# Patient Record
Sex: Male | Born: 1940 | Race: White | Hispanic: No | Marital: Married | Smoking: Never smoker
Health system: Southern US, Community
[De-identification: ages and names within clinical notes are randomized; demographics above are authoritative.]

## PROBLEM LIST (undated history)

## (undated) DIAGNOSIS — I495 Sick sinus syndrome: Secondary | ICD-10-CM

## (undated) DIAGNOSIS — I4891 Unspecified atrial fibrillation: Secondary | ICD-10-CM

## (undated) DIAGNOSIS — E785 Hyperlipidemia, unspecified: Secondary | ICD-10-CM

## (undated) DIAGNOSIS — N200 Calculus of kidney: Secondary | ICD-10-CM

## (undated) DIAGNOSIS — I10 Essential (primary) hypertension: Secondary | ICD-10-CM

## (undated) HISTORY — DX: Sick sinus syndrome: I49.5

## (undated) HISTORY — DX: Essential (primary) hypertension: I10

## (undated) HISTORY — DX: Unspecified atrial fibrillation: I48.91

## (undated) HISTORY — PX: INSERT / REPLACE / REMOVE PACEMAKER: SUR710

## (undated) HISTORY — DX: Hyperlipidemia, unspecified: E78.5

## (undated) HISTORY — DX: Calculus of kidney: N20.0

---

## 2008-10-19 ENCOUNTER — Telehealth (INDEPENDENT_AMBULATORY_CARE_PROVIDER_SITE_OTHER): Payer: Self-pay | Admitting: *Deleted

## 2008-10-19 ENCOUNTER — Ambulatory Visit: Payer: Self-pay | Admitting: Internal Medicine

## 2008-10-26 ENCOUNTER — Ambulatory Visit: Payer: Self-pay | Admitting: Cardiovascular Disease

## 2008-10-28 DIAGNOSIS — N200 Calculus of kidney: Secondary | ICD-10-CM | POA: Insufficient documentation

## 2008-10-28 DIAGNOSIS — I4891 Unspecified atrial fibrillation: Secondary | ICD-10-CM

## 2008-10-28 DIAGNOSIS — E785 Hyperlipidemia, unspecified: Secondary | ICD-10-CM | POA: Insufficient documentation

## 2008-10-28 DIAGNOSIS — I10 Essential (primary) hypertension: Secondary | ICD-10-CM | POA: Insufficient documentation

## 2008-10-28 DIAGNOSIS — Z950 Presence of cardiac pacemaker: Secondary | ICD-10-CM | POA: Insufficient documentation

## 2008-10-28 DIAGNOSIS — I480 Paroxysmal atrial fibrillation: Secondary | ICD-10-CM | POA: Insufficient documentation

## 2008-10-28 DIAGNOSIS — I495 Sick sinus syndrome: Secondary | ICD-10-CM | POA: Insufficient documentation

## 2008-11-02 ENCOUNTER — Ambulatory Visit: Payer: Self-pay | Admitting: Cardiology

## 2008-11-09 ENCOUNTER — Ambulatory Visit: Payer: Self-pay | Admitting: Internal Medicine

## 2008-11-30 ENCOUNTER — Ambulatory Visit: Payer: Self-pay | Admitting: Cardiovascular Disease

## 2008-12-08 ENCOUNTER — Encounter: Payer: Self-pay | Admitting: *Deleted

## 2009-01-13 ENCOUNTER — Encounter: Payer: Self-pay | Admitting: *Deleted

## 2009-02-15 ENCOUNTER — Encounter: Payer: Self-pay | Admitting: Cardiology

## 2009-02-15 ENCOUNTER — Ambulatory Visit: Payer: Self-pay | Admitting: Cardiology

## 2009-02-15 LAB — CONVERTED CEMR LAB: POC INR: 1.6

## 2009-03-01 ENCOUNTER — Ambulatory Visit: Payer: Self-pay | Admitting: Cardiovascular Disease

## 2009-03-01 ENCOUNTER — Encounter (INDEPENDENT_AMBULATORY_CARE_PROVIDER_SITE_OTHER): Payer: Self-pay | Admitting: Cardiology

## 2009-03-29 ENCOUNTER — Ambulatory Visit: Payer: Self-pay | Admitting: Cardiology

## 2009-03-29 LAB — CONVERTED CEMR LAB: POC INR: 2.5

## 2009-04-26 ENCOUNTER — Ambulatory Visit: Payer: Self-pay | Admitting: Cardiovascular Disease

## 2009-04-26 LAB — CONVERTED CEMR LAB: POC INR: 1.6

## 2009-05-20 ENCOUNTER — Ambulatory Visit: Payer: Self-pay | Admitting: Internal Medicine

## 2009-05-20 LAB — CONVERTED CEMR LAB: POC INR: 2.5

## 2009-06-10 ENCOUNTER — Ambulatory Visit: Payer: Self-pay | Admitting: Cardiovascular Disease

## 2009-06-10 LAB — CONVERTED CEMR LAB
INR: 3.4
POC INR: 3.4

## 2009-07-01 ENCOUNTER — Ambulatory Visit: Payer: Self-pay | Admitting: Cardiology

## 2009-07-29 ENCOUNTER — Ambulatory Visit: Payer: Self-pay | Admitting: Cardiology

## 2009-08-31 ENCOUNTER — Ambulatory Visit: Payer: Self-pay | Admitting: Cardiology

## 2009-09-29 ENCOUNTER — Ambulatory Visit: Payer: Self-pay | Admitting: Internal Medicine

## 2009-09-29 LAB — CONVERTED CEMR LAB: POC INR: 2.6

## 2009-10-27 ENCOUNTER — Ambulatory Visit: Payer: Self-pay | Admitting: Cardiology

## 2009-10-27 LAB — CONVERTED CEMR LAB: POC INR: 3.3

## 2009-11-16 ENCOUNTER — Ambulatory Visit: Payer: Self-pay | Admitting: Cardiovascular Disease

## 2009-11-16 ENCOUNTER — Ambulatory Visit: Payer: Self-pay | Admitting: Internal Medicine

## 2009-11-16 ENCOUNTER — Encounter: Payer: Self-pay | Admitting: Internal Medicine

## 2009-11-16 LAB — CONVERTED CEMR LAB: POC INR: 2.8

## 2010-01-21 ENCOUNTER — Telehealth (INDEPENDENT_AMBULATORY_CARE_PROVIDER_SITE_OTHER): Payer: Self-pay

## 2010-01-21 ENCOUNTER — Encounter (INDEPENDENT_AMBULATORY_CARE_PROVIDER_SITE_OTHER): Payer: Self-pay | Admitting: Pharmacist

## 2010-01-27 ENCOUNTER — Ambulatory Visit: Payer: Self-pay | Admitting: Cardiology

## 2010-01-27 LAB — CONVERTED CEMR LAB: POC INR: 2.6

## 2010-03-29 ENCOUNTER — Ambulatory Visit: Payer: Self-pay | Admitting: Cardiology

## 2010-03-29 LAB — CONVERTED CEMR LAB: POC INR: 2

## 2010-06-08 ENCOUNTER — Telehealth: Payer: Self-pay | Admitting: Internal Medicine

## 2010-06-09 ENCOUNTER — Ambulatory Visit: Payer: Self-pay | Admitting: Cardiovascular Disease

## 2010-06-09 LAB — CONVERTED CEMR LAB: POC INR: 2.6

## 2010-06-17 ENCOUNTER — Encounter: Payer: Self-pay | Admitting: Internal Medicine

## 2010-06-17 ENCOUNTER — Ambulatory Visit: Payer: Self-pay | Admitting: Internal Medicine

## 2010-06-17 ENCOUNTER — Encounter: Payer: Self-pay | Admitting: Physician Assistant

## 2010-06-24 ENCOUNTER — Telehealth (INDEPENDENT_AMBULATORY_CARE_PROVIDER_SITE_OTHER): Payer: Self-pay | Admitting: *Deleted

## 2010-06-27 ENCOUNTER — Ambulatory Visit: Payer: Self-pay

## 2010-06-27 ENCOUNTER — Ambulatory Visit: Payer: Self-pay | Admitting: Internal Medicine

## 2010-07-13 ENCOUNTER — Encounter: Payer: Self-pay | Admitting: Internal Medicine

## 2010-07-25 ENCOUNTER — Ambulatory Visit: Admission: RE | Admit: 2010-07-25 | Discharge: 2010-07-25 | Payer: Self-pay | Source: Home / Self Care

## 2010-07-25 LAB — CONVERTED CEMR LAB: POC INR: 1.6

## 2010-08-06 IMAGING — CR DG CHEST 2V
2 series · 2 of 2 positions shown · non-contrast
Comparison: None.

CLINICAL DATA: Paroxysmal atrial fibrillation

CHEST - 2 VIEW

[view not recorded (1 of 2)]
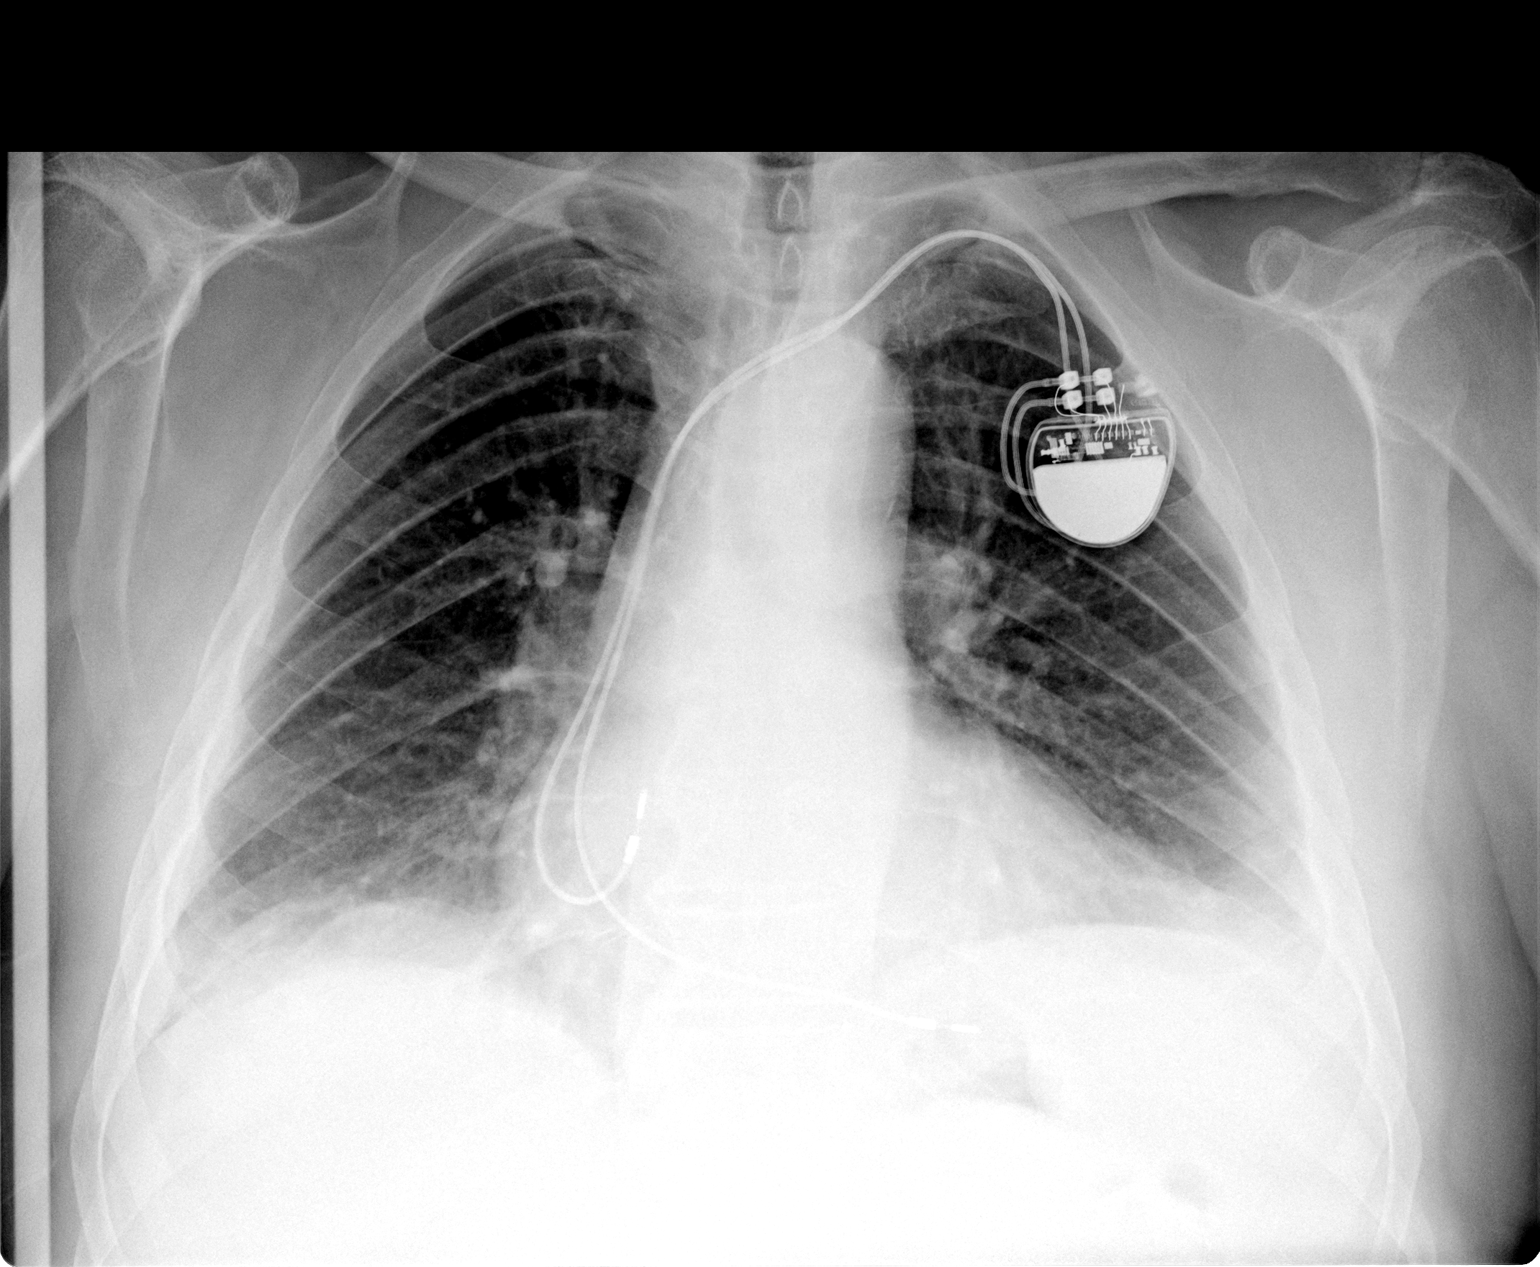

[view not recorded (2 of 2)]
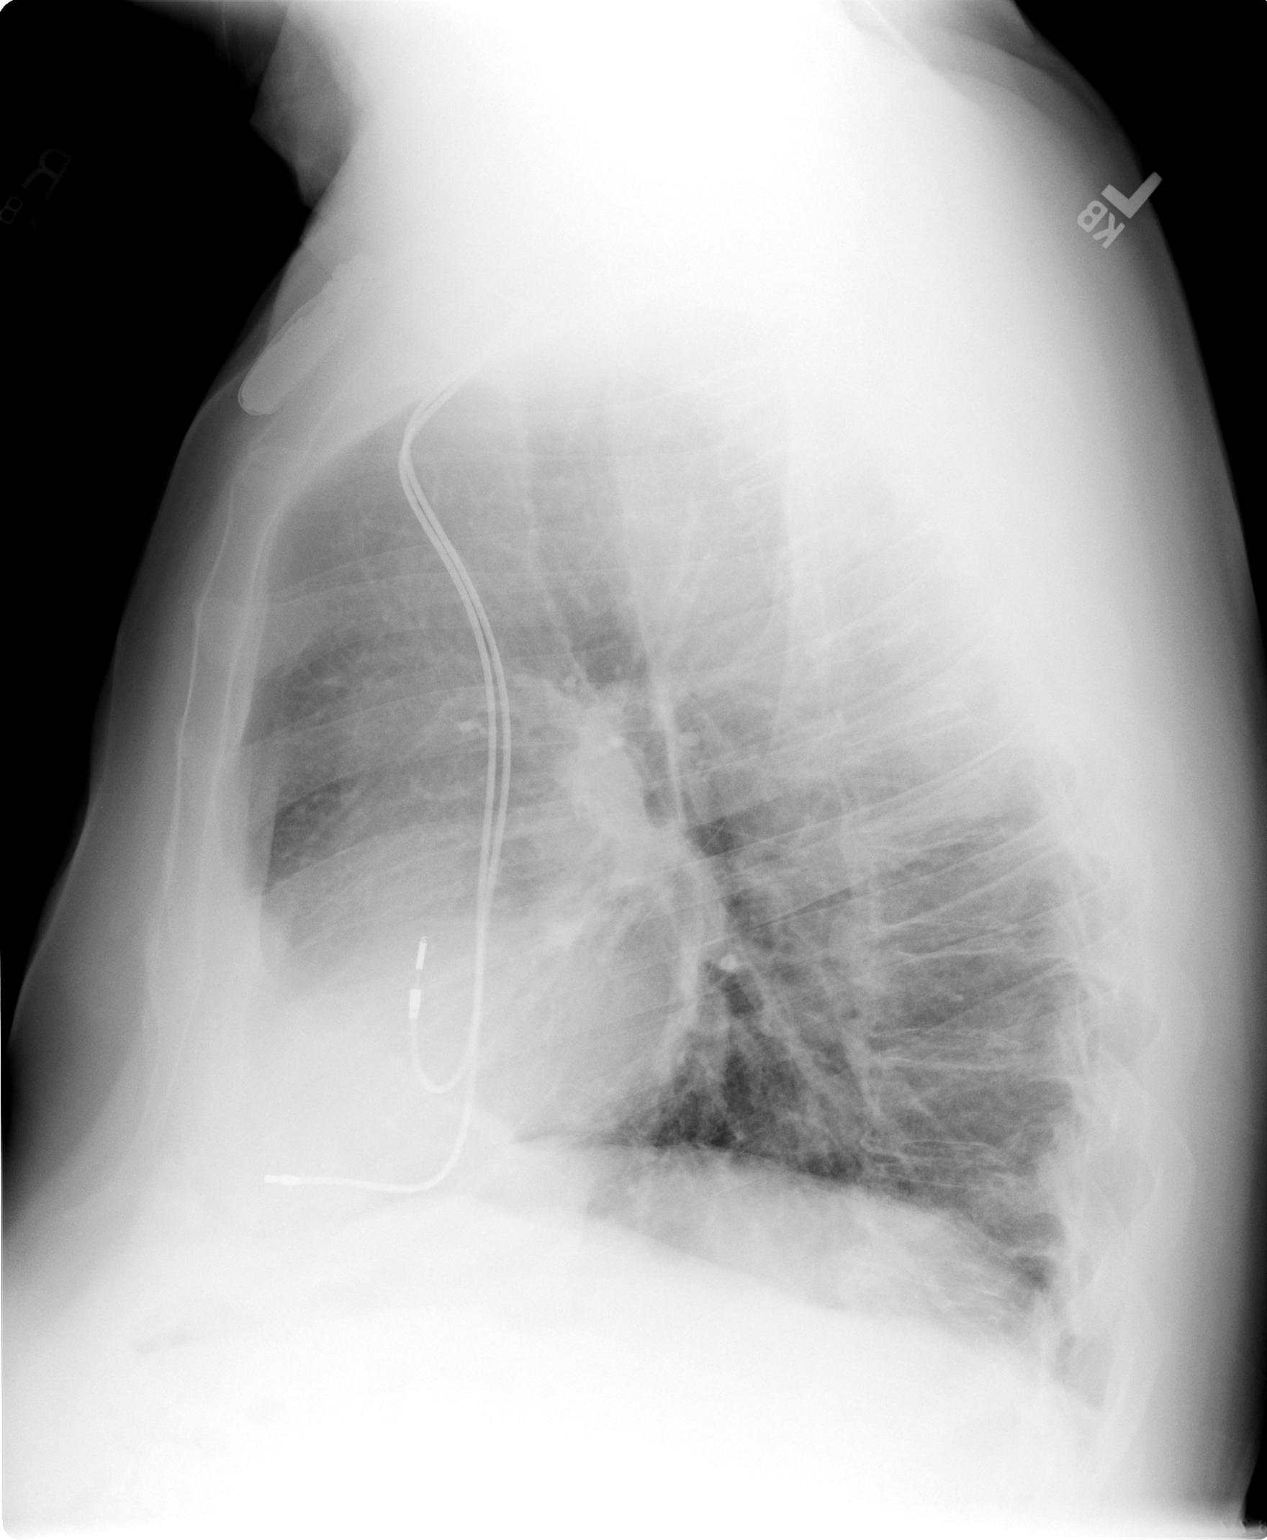

[2 of 2 positions shown; findings below may reference images not displayed]

FINDINGS: Left subclavian dual lead pacer is present, with leads
projecting in the right atrium and right ventricle.  There is
borderline cardiomegaly.  Pulmonary vascularity is mildly
prominent.  Mild bibasilar atelectasis is present.  No definite
pulmonary edema or pleural effusion.  Negative for pneumothorax.
Degenerative changes of the thoracic.  No acute osseous
abnormality.
IMPRESSION: 1.  Borderline cardiomegaly and mild pulmonary vascular congestion.
2.  Mild bibasilar atelectasis.

## 2010-08-08 ENCOUNTER — Ambulatory Visit: Admission: RE | Admit: 2010-08-08 | Discharge: 2010-08-08 | Payer: Self-pay | Source: Home / Self Care

## 2010-08-09 NOTE — Letter (Signed)
Summary: [facility] - Delinquent Coumadin 1  Coumadin  [address]   [place], [place] [address]   Phone: [phone]  Fax: [phone]     January 21, 2010 MRN: [id number]   Jacob Skinner [address] [place], [place]  [address]   Dear [name],  This letter is being sent to you as a reminder that it is necessary for you to get your INR/PT checked regularly so that we can optimize your care.  Our records indicate that you were scheduled to have a test done recently.  As of today, we have not received the results of this test.  It is very important that you have your INR checked.  Please call our office at the number listed above to schedule an appointment at your earliest convenience.    If you have recently had your protime checked or have discontinued this medication, please contact our office at the above phone number to clarify this issue.  Thank you for this prompt attention to this important health care matter.  Sincerely,   [facility] Team

## 2010-08-09 NOTE — Medication Information (Signed)
Summary: rov/ewj  Anticoagulant Therapy  Managed by: Weston Brass, PharmD Supervising MD: Shirlee Latch MD, Merlean Pizzini Indication 1: Atrial Fibrillation Lab Used: [facility] Site: [facility] INR POC 3.3 INR RANGE 2 - 3  Dietary changes: no    Health status changes: no    Bleeding/hemorrhagic complications: no    Recent/future hospitalizations: no    Any changes in medication regimen? no    Recent/future dental: no  Any missed doses?: no       Is patient compliant with meds? yes       Allergies: 1)  ! * Theraflu  Anticoagulation Management History:      The patient is taking warfarin and comes in today for a routine follow up visit.  Positive risk factors for bleeding include an age of 70 years or older.  The bleeding index is 'intermediate risk'.  Positive CHADS2 values include History of HTN.  Negative CHADS2 values include Age > 59 years old.  The start date was 10/19/2008.  His last INR was 3.4.  Anticoagulation responsible provider: Shirlee Latch MD, Caterine Mcmeans.  INR POC: 3.3.  Cuvette Lot#: [id number].  Exp: 11/2010.    Anticoagulation Management Assessment/Plan:      The patient's current anticoagulation dose is Warfarin sodium 5 mg tabs: take as directed.  The target INR is 2 - 3.  The next INR is due 11/17/2009.  Anticoagulation instructions were given to patient/spouse.  Results were reviewed/authorized by Weston Brass, PharmD.  He was notified by Weston Brass PharmD.         Prior Anticoagulation Instructions: INR 2.6  Continue on same dosage 1/2 tablet daily except 1 tablet on Mondays, Wednesdays, and Fridays.  Recheck in 4 weeks.    Current Anticoagulation Instructions: INR 3.3  Take 1/2 tablet today then resume same dose of 1/2 tablet every day except 1 tablet on Monday, Wednesday, and Friday

## 2010-08-09 NOTE — Medication Information (Signed)
Summary: rov/tm  Anticoagulant Therapy  Managed by: Cloyde Reams, RN, BSN Supervising MD: Johney Frame MD, Fayrene Fearing Indication 1: Atrial Fibrillation Lab Used: [facility] Site: [facility] INR POC 2.6 INR RANGE 2 - 3  Dietary changes: no    Health status changes: no    Bleeding/hemorrhagic complications: no    Recent/future hospitalizations: no    Any changes in medication regimen? no    Recent/future dental: no  Any missed doses?: no       Is patient compliant with meds? yes       Allergies (verified): 1)  ! * Theraflu  Anticoagulation Management History:      The patient is taking warfarin and comes in today for a routine follow up visit.  Positive risk factors for bleeding include an age of 70 years or older.  The bleeding index is 'intermediate risk'.  Positive CHADS2 values include History of HTN.  Negative CHADS2 values include Age > 43 years old.  The start date was 10/19/2008.  His last INR was 3.4.  Anticoagulation responsible provider: Mahalie Kanner MD, Fayrene Fearing.  INR POC: 2.6.  Cuvette Lot#: [id number].  Exp: 11/2010.    Anticoagulation Management Assessment/Plan:      The patient's current anticoagulation dose is Warfarin sodium 5 mg tabs: take as directed.  The target INR is 2 - 3.  The next INR is due 10/27/2009.  Anticoagulation instructions were given to patient/spouse.  Results were reviewed/authorized by Cloyde Reams, RN, BSN.  He was notified by Cloyde Reams RN.         Prior Anticoagulation Instructions: INR 3.4 Skip today then resume 1/2 pill everyday except 1 pill on Mondays, Wednesdays and Fridays. Recheck in 3 weeks.   Current Anticoagulation Instructions: INR 2.6  Continue on same dosage 1/2 tablet daily except 1 tablet on Mondays, Wednesdays, and Fridays.  Recheck in 4 weeks.

## 2010-08-09 NOTE — Medication Information (Signed)
Summary: rov/ewj  Anticoagulant Therapy  Managed by: Cloyde Reams, RN, BSN Supervising MD: Riley Kill MD, Maisie Fus Indication 1: Atrial Fibrillation Lab Used: [facility] Site: [facility] INR POC 2.0 INR RANGE 2 - 3  Dietary changes: no    Health status changes: no    Bleeding/hemorrhagic complications: no    Recent/future hospitalizations: no    Any changes in medication regimen? yes       Details: Started on Gabapentin 07/10/09 for neuropathy.  Recent/future dental: no  Any missed doses?: no       Is patient compliant with meds? yes       Current Medications (verified): 1)  Amlodipine Besy-Benazepril Hcl 10-20 Mg Caps (Amlodipine Besy-Benazepril Hcl) .... Take One Tablet Once Daily 2)  Amiodarone Hcl 200 Mg Tabs (Amiodarone Hcl) .... Take 1 Tablet Every Day 3)  Metoprolol Succinate 200 Mg Xr24h-Tab (Metoprolol Succinate) .... Take 1 Tablet Once Daily 4)  Hydrochlorothiazide 25 Mg Tabs (Hydrochlorothiazide) .... Take 1 Tablet Once Daily 5)  Crestor 20 Mg Tabs (Rosuvastatin Calcium) .... Take 1 Tablet Once Daily 6)  Warfarin Sodium 5 Mg Tabs (Warfarin Sodium) .... Take As Directed 7)  Nexium 40 Mg Cpdr (Esomeprazole Magnesium) .... Take 1 Tablet Once Daily 8)  Gabapentin 300 Mg Caps (Gabapentin) .... Take 1 Tablet By Mouth Three Times A Day  Allergies (verified): 1)  ! * Theraflu  Anticoagulation Management History:      The patient is taking warfarin and comes in today for a routine follow up visit.  Positive risk factors for bleeding include an age of 70 years or older.  The bleeding index is 'intermediate risk'.  Positive CHADS2 values include History of HTN.  Negative CHADS2 values include Age > 11 years old.  The start date was 10/19/2008.  His last INR was 3.4.  Anticoagulation responsible provider: Riley Kill MD, Maisie Fus.  INR POC: 2.0.  Cuvette Lot#: [id number].  Exp: 10/2010.    Anticoagulation Management Assessment/Plan:      The patient's current anticoagulation dose is  Warfarin sodium 5 mg tabs: take as directed.  The target INR is 2 - 3.  The next INR is due 08/26/2009.  Anticoagulation instructions were given to patient/spouse.  Results were reviewed/authorized by Cloyde Reams, RN, BSN.  He was notified by Cloyde Reams RN.         Prior Anticoagulation Instructions: INR 2.4  Continue on same dosage 1/2 tablet daily except 1 tablet on Mondays, Wednesdays, and Fridays.   Recheck in 4 weeks.    Current Anticoagulation Instructions: INR 2.0  Continue on same dosage 1/2 tablet daily except 1 tablet on Mondays, Wednesdays, and Fridays.  Recheck in 4 weeks.

## 2010-08-09 NOTE — Cardiovascular Report (Signed)
Summary: Office Visit   Office Visit   Imported By: Roderic Ovens 11/26/2009 16:29:46  _____________________________________________________________________  External Attachment:    Type:   Image     Comment:   External Document

## 2010-08-09 NOTE — Assessment & Plan Note (Signed)
Summary: per check /need chest xray also   Visit Type:  Follow-up   History of Present Illness: Jacob Skinner returns today for followup.  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  He has a h/o symptomatic tachy-brady syndrome, HTN and dyslipidemia and PAF.  He has very mild palpitations. Since I saw him last, his only complaint is musculoskeletal pain in the back.  Current Medications (verified): 1)  Amlodipine Besy-Benazepril Hcl 10-20 Mg Caps (Amlodipine Besy-Benazepril Hcl) .... Take One Tablet Once Daily 2)  Amiodarone Hcl 200 Mg Tabs (Amiodarone Hcl) .... Take 1 Tablet Every Day 3)  Metoprolol Succinate 200 Mg Xr24h-Tab (Metoprolol Succinate) .... Take 1 Tablet Once Daily 4)  Hydrochlorothiazide 25 Mg Tabs (Hydrochlorothiazide) .... Take 1 Tablet Once Daily 5)  Crestor 20 Mg Tabs (Rosuvastatin Calcium) .... Take 1 Tablet Once Daily 6)  Warfarin Sodium 5 Mg Tabs (Warfarin Sodium) .... Take As Directed 7)  Nexium 40 Mg Cpdr (Esomeprazole Magnesium) .... Take 1 Tablet Once Daily  Allergies (verified): 1)  ! 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    Vital Signs:  Patient profile:   70 year old male Height:      70 inches Weight:      231 pounds BMI:     33.26 Pulse rate:   65 / minute BP sitting:   160 / 90  (left arm)  Vitals Entered By: Laurance Flatten CMA (Nov 16, 2009 2:36 PM)  Physical Exam  General:  Well developed, well nourished, in no acute distress. Head:  normocephalic and atraumatic Eyes:  PERRLA/EOM intact;  conjunctiva and lids normal. Mouth:  Teeth, gums and palate normal. Oral mucosa normal. Neck:  Neck supple, no JVD. No masses, thyromegaly or abnormal cervical nodes. Chest Wall:  Well healed PPM function. Lungs:  Clear bilaterally to auscultation.  No wheezes, rales, or rhonchi Heart:  RRR with normal S1 and S2.  PMI is not enlarged or laterally displaced. No murmurs. Abdomen:  Bowel sounds positive; abdomen soft and non-tender without masses, organomegaly, or hernias noted. No hepatosplenomegaly. Msk:  Back normal, normal gait. Muscle strength and tone normal. Pulses:  pulses normal in all 4 extremities Extremities:  No clubbing or cyanosis. No edema. Neurologic:  Alert and oriented x 3.   PPM Specifications Following MD:  Jacob Bunting, MD     PPM Vendor:  [facility] [employer]     PPM Model Number:  [id number]     PPM Serial Number:  [id number] PPM DOI:  05/04/2005     PPM Implanting MD:  Colonel Bald, M.D.  Lead 1    Location: RA     DOI: 05/04/2005     Model #: [id number]     Serial #: [id number]     Status: active Lead 2    Location: RV     DOI: 05/04/2009     Model #: [id number]     Serial #: [id number]     Status: active  PPM Follow Up Remote Check?  No Battery Voltage:  good V     Battery Est. Longevity:  > 5 years     Pacer Dependent:  No  PPM Device Measurements Atrium  Amplitude: 3.0 mV, Impedance: 820 ohms, Threshold: 1.0 V at 0.5 msec Right Ventricle  Amplitude: 10.3 mV, Impedance: 820 ohms, Threshold: 1.3 V at 0.5 msec  Episodes MS Episodes:  18.4K     Percent Mode Switch:  21%     Coumadin:  Yes Atrial Pacing:  48%     Ventricular Pacing:  30%  Parameters Mode:  DDDR     Lower Rate Limit:  60     Upper Rate Limit:  120 Paced AV Delay:  300     Sensed AV Delay:  140 Next Cardiology Appt Due:  05/10/2010 Tech Comments:  No parameter changes.  Device function normal.  ROV 5months clinic. Altha Harm, LPN  Nov 16, 2009 2:44 PM  MD Comments:  Agree with above.   Impression &  Recommendations:  Problem # 1:  PAROXYSMAL ATRIAL FIBRILLATION (ICD-427.31) He is out of rhythm about 20% of the time but is asymptomatic.  I would recommend continued medical therapy as he has done and I will see him back in several months. His updated medication list for this problem includes:    Amiodarone Hcl 200 Mg Tabs (Amiodarone hcl) .Marland Kitchen... Take 1 tablet every day    Metoprolol Succinate 200 Mg Xr24h-tab (Metoprolol succinate) .Marland Kitchen... Take 1 tablet once daily    Warfarin Sodium 5 Mg Tabs (Warfarin sodium) .Marland Kitchen... Take as directed  Problem # 2:  HYPERTENSION (ICD-401.9) His blood pressure is elevated. A low sodium diet is requested.  He will continue his current meds. His updated medication list for this problem includes:    Amlodipine Besy-benazepril Hcl 10-20 Mg Caps (Amlodipine besy-benazepril hcl) .Marland Kitchen... Take one tablet once daily    Metoprolol Succinate 200 Mg Xr24h-tab (Metoprolol succinate) .Marland Kitchen... Take 1 tablet once daily    Hydrochlorothiazide 25 Mg Tabs (Hydrochlorothiazide) .Marland Kitchen... Take 1 tablet once daily  Problem # 3:  CARDIAC PACEMAKER IN SITU (ICD-V45.01) his device is working normally.  Will recheck in several months.  Patient Instructions: 1)  Your physician recommends that you schedule a follow-up appointment in: 6 months with Dr Ladona Ridgel Prescriptions: NEXIUM 40 MG CPDR (ESOMEPRAZOLE MAGNESIUM) take 1 tablet once daily  #90 x 3   Entered by:   Dennis Bast, RN, BSN   Authorized by:   Laren Boom, MD, [facility]   Signed by:   Dennis Bast, RN, BSN on 11/16/2009   Method used:   Electronically to        [id number] (retail)       [address]       [place], [place]  [address]       Ph: [phone]       Fax: [phone]   RxID:   [id number] WARFARIN SODIUM 5 MG TABS (WARFARIN SODIUM) take as directed  #90.0 Each x 2   Entered by:   Dennis Bast, RN, BSN   Authorized by:   Laren Boom, MD, [facility]   Signed by:   Dennis Bast, RN, BSN on  11/16/2009   Method used:   Electronically to        [id number] (retail)       [address] [facility] [address]       [place], [place]  [address]       Ph: [phone]       Fax: [phone]   RxID:   [id number] CRESTOR 20 MG TABS (ROSUVASTATIN CALCIUM) take 1 tablet once daily  #90  x 3   Entered by:   Dennis Bast, RN, BSN   Authorized by:   Laren Boom, MD, [facility]   Signed by:   Dennis Bast, RN, BSN on 11/16/2009   Method used:   Electronically to        [id number] (retail)       [address] [facility] [address]       [place], [place]  [address]       Ph: [phone]       Fax: [phone]   RxID:   [id number] HYDROCHLOROTHIAZIDE 25 MG TABS (HYDROCHLOROTHIAZIDE) take 1 tablet once daily  #90 x 3   Entered by:   Dennis Bast, RN, BSN   Authorized by:   Laren Boom, MD, [facility]   Signed by:   Dennis Bast, RN, BSN on 11/16/2009   Method used:   Electronically to        [id number] (retail)       [address] [facility] [address]       [place], [place]  [address]       Ph: [phone]       Fax: [phone]   RxID:   [id number] METOPROLOL SUCCINATE 200 MG XR24H-TAB (METOPROLOL SUCCINATE) take 1 tablet once daily  #90 x 3   Entered by:   Dennis Bast, RN, BSN   Authorized by:   Laren Boom, MD, [facility]   Signed by:   Dennis Bast, RN, BSN on 11/16/2009   Method used:   Electronically to        [id number] (retail)       [address] [facility] [address]       [place], [place]  [address]       Ph: [phone]       Fax: [phone]   RxID:   [id number] AMIODARONE HCL 200 MG TABS (AMIODARONE HCL) take 1 tablet every day  #90 x 3   Entered by:   Dennis Bast, RN, BSN   Authorized by:   Laren Boom, MD, [facility]   Signed by:   Dennis Bast, RN, BSN on 11/16/2009   Method used:   Electronically to        [id number] (retail)       [address] [facility] [address]       [place], [place]  [address]       Ph: [phone]       Fax: [phone]   RxID:    [id number] AMLODIPINE BESY-BENAZEPRIL HCL 10-20 MG CAPS (AMLODIPINE BESY-BENAZEPRIL HCL) take one tablet once daily  #90 x 3   Entered by:   Dennis Bast, RN, BSN   Authorized by:   Laren Boom, MD, [facility]   Signed by:   Dennis Bast, RN, BSN on 11/16/2009   Method used:   Electronically to        [id number] (retail)       [address]       [place], [place]  [address]       Ph: [phone]       Fax: [phone]   RxID:   [id number]

## 2010-08-09 NOTE — Medication Information (Signed)
Summary: ccr  Anticoagulant Therapy  Managed by: Weston Brass, PharmD Referring MD: Tora Kindred MD: Eden Emms MD, Theron Arista Indication 1: Atrial Fibrillation Lab Used: [facility] Site: [facility] INR POC 2.6 INR RANGE 2 - 3  Dietary changes: no    Health status changes: no    Bleeding/hemorrhagic complications: no    Recent/future hospitalizations: yes       Details: pending hernia surgery.  Waiting clearance from Dr. Ladona Ridgel  Any changes in medication regimen? no    Recent/future dental: no  Any missed doses?: no       Is patient compliant with meds? yes       Allergies: 1)  ! * Theraflu  Anticoagulation Management History:      The patient is taking warfarin and comes in today for a routine follow up visit.  Positive risk factors for bleeding include an age of 70 years or older.  The bleeding index is 'intermediate risk'.  Positive CHADS2 values include History of HTN.  Negative CHADS2 values include Age > 70 years old.  The start date was 10/19/2008.  His last INR was 3.4.  Anticoagulation responsible provider: Eden Emms MD, Theron Arista.  INR POC: 2.6.  Cuvette Lot#: [id number].  Exp: 06/2011.    Anticoagulation Management Assessment/Plan:      The patient's current anticoagulation dose is Warfarin sodium 5 mg tabs: take as directed.  The target INR is 2 - 3.  The next INR is due 06/27/2010.  Anticoagulation instructions were given to patient/spouse.  Results were reviewed/authorized by Weston Brass, PharmD.  He was notified by Weston Brass PharmD.         Prior Anticoagulation Instructions: INR 2.0  Continue same dose of 1/2 tablet every day except 1 tablet on Monday, Wednesday and Friday.  Recheck INR in 4 weeks.   Current Anticoagulation Instructions: INR 2.6  Continue same dose of 1/2 tablet every day except 1 tablet on Monday, Wednesday and Friday.  Recheck INR in 2-3 weeks.

## 2010-08-09 NOTE — Medication Information (Signed)
Summary: CCR  Anticoagulant Therapy  Managed by: Bethena Midget, RN, BSN Supervising MD: Daleen Squibb MD, Maisie Fus Indication 1: Atrial Fibrillation Lab Used: [facility] Site: [facility] INR POC 3.4 INR RANGE 2 - 3  Dietary changes: yes       Details: Had Beer today.   Health status changes: no    Bleeding/hemorrhagic complications: no    Recent/future hospitalizations: no    Any changes in medication regimen? no    Recent/future dental: no  Any missed doses?: no       Is patient compliant with meds? yes       Allergies: 1)  ! * Theraflu  Anticoagulation Management History:      The patient is taking warfarin and comes in today for a routine follow up visit.  Positive risk factors for bleeding include an age of 70 years or older.  The bleeding index is 'intermediate risk'.  Positive CHADS2 values include History of HTN.  Negative CHADS2 values include Age > 84 years old.  The start date was 10/19/2008.  His last INR was 3.4.  Anticoagulation responsible provider: Daleen Squibb MD, Maisie Fus.  INR POC: 3.4.  Cuvette Lot#: [id number].  Exp: 10/2010.    Anticoagulation Management Assessment/Plan:      The patient's current anticoagulation dose is Warfarin sodium 5 mg tabs: take as directed.  The target INR is 2 - 3.  The next INR is due 09/21/2009.  Anticoagulation instructions were given to patient/spouse.  Results were reviewed/authorized by Bethena Midget, RN, BSN.  He was notified by Bethena Midget, RN, BSN.         Prior Anticoagulation Instructions: INR 2.0  Continue on same dosage 1/2 tablet daily except 1 tablet on Mondays, Wednesdays, and Fridays.  Recheck in 4 weeks.    Current Anticoagulation Instructions: INR 3.4 Skip today then resume 1/2 pill everyday except 1 pill on Mondays, Wednesdays and Fridays. Recheck in 3 weeks.

## 2010-08-09 NOTE — Medication Information (Signed)
Summary: CCR/ GD  Anticoagulant Therapy  Managed by: Weston Brass, PharmD Supervising MD: Daleen Squibb MD, Maisie Fus Indication 1: Atrial Fibrillation Lab Used: [facility] Site: [facility] INR POC 2.0 INR RANGE 2 - 3  Dietary changes: no    Health status changes: no    Bleeding/hemorrhagic complications: no    Recent/future hospitalizations: no    Any changes in medication regimen? no    Recent/future dental: no  Any missed doses?: no       Is patient compliant with meds? yes       Allergies: 1)  ! * Theraflu  Anticoagulation Management History:      The patient is taking warfarin and comes in today for a routine follow up visit.  Positive risk factors for bleeding include an age of 70 years or older.  The bleeding index is 'intermediate risk'.  Positive CHADS2 values include History of HTN.  Negative CHADS2 values include Age > 70 years old.  The start date was 10/19/2008.  His last INR was 3.4.  Anticoagulation responsible provider: Daleen Squibb MD, Maisie Fus.  INR POC: 2.0.  Cuvette Lot#: [id number].  Exp: 05/2011.    Anticoagulation Management Assessment/Plan:      The patient's current anticoagulation dose is Warfarin sodium 5 mg tabs: take as directed.  The target INR is 2 - 3.  The next INR is due 04/26/2010.  Anticoagulation instructions were given to patient/spouse.  Results were reviewed/authorized by Weston Brass, PharmD.  He was notified by Weston Brass PharmD.         Prior Anticoagulation Instructions: INR 2.6 Continue 1/2 pill everyday except 1 pill on Mondays, Wednesdays and Fridays. Recheck in 4 weeks.   Current Anticoagulation Instructions: INR 2.0  Continue same dose of 1/2 tablet every day except 1 tablet on Monday, Wednesday and Friday.  Recheck INR in 4 weeks.

## 2010-08-09 NOTE — Progress Notes (Signed)
Summary: Overdue for Coumadin f/u  Phone Note Outgoing Call   Call placed by: Cloyde Reams RN,  January 21, 2010 11:20 AM Call placed to: Patient Summary of Call: Attempted to contact pt, no answer and voicemail is not set up.  Pt is overdue for coumadin follow-up, last check 11/2009.  Sent pt delinquent letter.  Initial call taken by: Cloyde Reams RN,  January 21, 2010 11:22 AM

## 2010-08-09 NOTE — Assessment & Plan Note (Signed)
Summary: surgical clearance/hernia repair/jml   Visit Type:  Pre-op Evaluation Referring Provider:  Dr. Lonzo Candy [facility])  CC:  no cardiac complaints.  History of Present Illness: Primary Electrophysiologist:  Dr. Lewayne Bunting  Jacob Skinner is a 70 yo male with no known h/o CAD, but a h/o parox. AFib with symptomatic tachy-brady syndrome, s/p pacemaker implantation who is on chronic coumadin therapy.  He presents for surgical clearance today.  He needs an umbilical and a left inguinal hernia repair.  He denies any chest pain or dyspnea.  No orthopnea or PND.  No syncope.  He has some slight edema.  No palps.  He works out in his yard.  He walks with his wife daily for about 40 minutes without chest pain or dyspnea.    Current Medications (verified): 1)  Amlodipine Besy-Benazepril Hcl 10-20 Mg Caps (Amlodipine Besy-Benazepril Hcl) .... Take One Tablet Once Daily 2)  Amiodarone Hcl 200 Mg Tabs (Amiodarone Hcl) .... Take 1 Tablet Every Day 3)  Metoprolol Succinate 200 Mg Xr24h-Tab (Metoprolol Succinate) .... Take 1 Tablet Once Daily 4)  Hydrochlorothiazide 25 Mg Tabs (Hydrochlorothiazide) .... Take 1 Tablet Once Daily 5)  Crestor 20 Mg Tabs (Rosuvastatin Calcium) .... Take 1 Tablet Once Daily 6)  Warfarin Sodium 5 Mg Tabs (Warfarin Sodium) .... Take As Directed 7)  Nexium 40 Mg Cpdr (Esomeprazole Magnesium) .... Take 1 Tablet Once Daily 8)  Lovaza 1 Gm Caps (Omega-3-Acid Ethyl Esters) .... Take Two Caps Daily  Allergies (verified): 1)  ! * Theraflu  Past History:  Past Medical History: Reviewed history from 10/28/2008 and no changes required. 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 Guidant pacemaker 10/06  Family  History: Reviewed history from 10/28/2008 and no changes required. 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  Alcohol Use - yes-minimal  Review of Systems       As per  the HPI.  All other systems reviewed and negative.   Vital Signs:  Patient profile:   69 year old male Height:      70 inches Weight:      250 pounds Pulse rate:   84 / minute Pulse rhythm:   regular BP sitting:   150 / 100  (left arm)  Vitals Entered By: Jacquelin Hawking, CMA (June 17, 2010 8:54 AM)  Physical Exam  General:  Well nourished, well developed, in no acute distress HEENT: normal Neck: no JVD Cardiac:  normal S1, S2; RRR; no murmur Lungs:  clear to auscultation bilaterally, no wheezing, rhonchi or rales Abd: soft, nontender, no hepatomegaly Ext: trace edema Vascular: no carotid  bruits Skin: warm and dry Neuro:  CNs 2-12 intact, no focal abnormalities noted Endo: no thyromegaly   EKG  Procedure date:  06/17/2010  Findings:      V paced  HR 73 cannot determine underlying rhythm  PPM Specifications Following MD:  Lewayne Bunting, MD     PPM Vendor:  [facility] Scientific     PPM Model Number:  [id number]     PPM Serial Number:  [id number] PPM DOI:  05/04/2005     PPM Implanting MD:  Colonel Bald, M.D.  Lead 1    Location: RA     DOI: 05/04/2005     Model #: [id number]  Serial #: [id number]     Status: active Lead 2    Location: RV     DOI: 05/04/2009     Model #: 1610     Serial #: [id number]     Status: active  PPM Follow Up Pacer Dependent:  No      Episodes Coumadin:  Yes  Parameters Mode:  DDDR     Lower Rate Limit:  60     Upper Rate Limit:  120 Paced AV Delay:  300     Sensed AV Delay:  140  Impression & Recommendations:  Problem # 1:  PRE-OPERATIVE CARDIOVASCULAR EXAMINATION (ICD-V72.81)  He is able to achieve > 4 METS without cardiac symptoms.  He has no unstable cardiac conditions.    According to ACC/AHA guidelines, he requires no further cardiac workup prior to his noncardiac surgery.  He should be at acceptable risk.   Problem # 2:  PAROXYSMAL ATRIAL FIBRILLATION (ICD-427.31)  He is in AFib today.  Upon interrogation of his device, he is in AFib 30% of the time. He does not have a history of stroke. He should be at low risk to hold his coumadin for his surgery.  I will make sure he follows up with our coumadin clinic to arrange holding his medicine.  Orders: EKG w/ Interpretation (93000)  Problem # 3:  CARDIAC PACEMAKER IN SITU (ICD-V45.01)  As above, his device was interrogated today. He can follow up with Dr. Ladona Ridgel in 6 months.  Problem # 4:  HYPERTENSION (ICD-401.9)  Elevated. No medications yet today. His BPs at home are usually in the 130s systolically.  Problem # 5:  DYSLIPIDEMIA (ICD-272.4)  He may want to check with his surgeon regarding whether or not to hold crestor and Lovaza for his surgery.  Patient Instructions: 1)  Continue all of your blood pressure medicines for your surgery. 2)  Ask your surgeon about wheter or not to hold Crestor and Lovaza prior to the surgery. 3)  We will have you see the coumadin clinic to coordinate holding coumadin for your surgery. 4)  Your physician recommends that you schedule a follow-up appointment in: 6 months with Dr. Ladona Ridgel

## 2010-08-09 NOTE — Medication Information (Signed)
Summary: rov/tm  Anticoagulant Therapy  Managed by: Bethena Midget, RN, BSN Supervising MD: Myrtis Ser MD, Tinnie Gens Indication 1: Atrial Fibrillation Lab Used: [facility] Site: [facility] INR POC 2.6 INR RANGE 2 - 3  Dietary changes: no    Health status changes: no    Bleeding/hemorrhagic complications: no    Recent/future hospitalizations: no    Any changes in medication regimen? no    Recent/future dental: no  Any missed doses?: no       Is patient compliant with meds? yes       Allergies: 1)  ! * Theraflu  Anticoagulation Management History:      The patient is taking warfarin and comes in today for a routine follow up visit.  Positive risk factors for bleeding include an age of 70 years or older.  The bleeding index is 'intermediate risk'.  Positive CHADS2 values include History of HTN.  Negative CHADS2 values include Age > 70 years old.  The start date was 10/19/2008.  His last INR was 3.4.  Anticoagulation responsible provider: Myrtis Ser MD, Tinnie Gens.  INR POC: 2.6.  Cuvette Lot#: [id number].  Exp: 04/2011.    Anticoagulation Management Assessment/Plan:      The patient's current anticoagulation dose is Warfarin sodium 5 mg tabs: take as directed.  The target INR is 2 - 3.  The next INR is due 02/24/2010.  Anticoagulation instructions were given to patient/spouse.  Results were reviewed/authorized by Bethena Midget, RN, BSN.  He was notified by Bethena Midget, RN, BSN.         Prior Anticoagulation Instructions: INR 2.8  Continue taking 1 tablet on Monday, wednesday, and Friday, and 1/2 tablet all other days.  Return to clinic in 4 weeks.    Current Anticoagulation Instructions: INR 2.6 Continue 1/2 pill everyday except 1 pill on Mondays, Wednesdays and Fridays. Recheck in 4 weeks.

## 2010-08-09 NOTE — Medication Information (Signed)
Summary: rov/sp  Anticoagulant Therapy  Managed by: Eda Keys, PharmD Supervising MD: Eden Emms MD, Theron Arista Indication 1: Atrial Fibrillation Lab Used: [facility] Site: [facility] INR POC 2.8 INR RANGE 2 - 3  Dietary changes: no    Health status changes: no    Bleeding/hemorrhagic complications: no    Recent/future hospitalizations: no    Any changes in medication regimen? no    Recent/future dental: no  Any missed doses?: no       Is patient compliant with meds? yes      Comments: Pt may have hernia operation coming up, will contact us when this is scheduled.    Allergies: 1)  ! * Theraflu  Anticoagulation Management History:      The patient is taking warfarin and comes in today for a routine follow up visit.  Positive risk factors for bleeding include an age of 70 years or older.  The bleeding index is 'intermediate risk'.  Positive CHADS2 values include History of HTN.  Negative CHADS2 values include Age > 70 years old.  The start date was 10/19/2008.  His last INR was 3.4.  Anticoagulation responsible provider: Eden Emms MD, Theron Arista.  INR POC: 2.8.  Cuvette Lot#: [id number].  Exp: 02/2011.    Anticoagulation Management Assessment/Plan:      The patient's current anticoagulation dose is Warfarin sodium 5 mg tabs: take as directed.  The target INR is 2 - 3.  The next INR is due 12/14/2009.  Anticoagulation instructions were given to patient/spouse.  Results were reviewed/authorized by Eda Keys, PharmD.  He was notified by Eda Keys.         Prior Anticoagulation Instructions: INR 3.3  Take 1/2 tablet today then resume same dose of 1/2 tablet every day except 1 tablet on Monday, Wednesday, and Friday   Current Anticoagulation Instructions: INR 2.8  Continue taking 1 tablet on Monday, wednesday, and Friday, and 1/2 tablet all other days.  Return to clinic in 4 weeks.

## 2010-08-09 NOTE — Miscellaneous (Signed)
Summary: Orders Update  Clinical Lists Changes  Orders: Added new Test order of T-2 View CXR (71020TC) - Signed 

## 2010-08-09 NOTE — Procedures (Signed)
Summary: Cardiology Device Clinic   Allergies: 1)  ! * Theraflu  PPM Specifications Following MD:  Lewayne Bunting, MD     PPM Vendor:  [facility] [employer]     PPM Model Number:  [id number]     PPM Serial Number:  [id number] PPM DOI:  05/04/2005     PPM Implanting MD:  Colonel Bald, M.D.  Lead 1    Location: RA     DOI: 05/04/2005     Model #: 4053     Serial #: [id number]     Status: active Lead 2    Location: RV     DOI: 05/04/2009     Model #: 2951     Serial #: [id number]     Status: active  PPM Follow Up Remote Check?  No Battery Voltage:  good V     Battery Est. Longevity:  >5 years     Pacer Dependent:  No       PPM Device Measurements Atrium  Amplitude: 0.7 mV, Impedance: 830 ohms,  Right Ventricle  Amplitude: 12 mV, Impedance: 860 ohms, Threshold: 1.4 V at 0.5 msec  Episodes Percent Mode Switch:  30%     Coumadin:  Yes Atrial Pacing:  39%     Ventricular Pacing:  33%  Parameters Mode:  DDDR     Lower Rate Limit:  60     Upper Rate Limit:  120 Paced AV Delay:  300     Sensed AV Delay:  140 Next Cardiology Appt Due:  12/09/2010 Tech Comments:  Atrial sensitivity reprogrammed.  A-fib today with controlled ventricular rates, + coumadin.  TTM's with [facility].   ROV 6 months with Dr. Ladona Ridgel. Altha Harm, LPN  June 17, 2010 9:41 AM

## 2010-08-11 NOTE — Medication Information (Signed)
Summary: rov/sp  Anticoagulant Therapy  Managed by: Bethena Midget, RN, BSN Referring MD: Tora Kindred MD: Daleen Squibb MD, Maisie Fus Indication 1: Atrial Fibrillation Lab Used: [facility]  Site: [facility] INR POC 1.6 INR RANGE 2 - 3  Dietary changes: no    Health status changes: no    Bleeding/hemorrhagic complications: no    Recent/future hospitalizations: no    Any changes in medication regimen? no    Recent/future dental: no  Any missed doses?: no       Is patient compliant with meds? yes       Allergies: 1)  ! * Theraflu  Anticoagulation Management History:      The patient is taking warfarin and comes in today for a routine follow up visit.  Positive risk factors for bleeding include an age of 70 years or older.  The bleeding index is 'intermediate risk'.  Positive CHADS2 values include History of HTN.  Negative CHADS2 values include Age > 80 years old.  The start date was 10/19/2008.  His last INR was 3.4.  Anticoagulation responsible provider: Daleen Squibb MD, Maisie Fus.  INR POC: 1.6.  Cuvette Lot#: [id number].  Exp: 08/2011.    Anticoagulation Management Assessment/Plan:      The patient's current anticoagulation dose is Warfarin sodium 5 mg tabs: take as directed.  The target INR is 2 - 3.  The next INR is due 08/08/2010.  Anticoagulation instructions were given to patient/spouse.  Results were reviewed/authorized by Bethena Midget, RN, BSN.  He was notified by Bethena Midget, RN, BSN.         Prior Anticoagulation Instructions: INR 2.0  Continue same dose of 1/2 tablet every day except 1 tablet on Monday, Wednesday and Friday.  Take last dose of Coumadin on 12/30.  Restart Coumadin at normal dose when okay with surgeon.   Current Anticoagulation Instructions: INR 1.6 Today take 1 1/2 pills then resume 1/2 pill everyday except 1 pill on Mondays, Wednesdays and Fridays. Recheck in 2 weeks.

## 2010-08-11 NOTE — Progress Notes (Signed)
Summary: surgical clearance/pt call for sur appt before dec 19th  Phone Note From Other Clinic   Caller: Nurse Summary of Call: Per Burna Mortimer Pt needs cardiac clearance and to stop coumadin for hernia repair. possible bilateral ofc [phone] fax [phone] Initial call taken by: Edman Circle,  June 08, 2010 4:38 PM  Follow-up for Phone Call        pt states needs appt for sur clearance. pt would like to talk to a nurse. pt needs appt before dec 19th. pt # [phone] Follow-up by: Roe Coombs,  June 14, 2010 3:57 PM  Additional Follow-up for Phone Call Additional follow up Details #1::        pt is seeing PA on Friday for surgical clearance per Dr Ladona Ridgel it will be ok to hold Coumadin Dennis Bast, RN, BSN  June 15, 2010 6:19 PM

## 2010-08-11 NOTE — Medication Information (Signed)
Summary: rov/sp  Anticoagulant Therapy  Managed by: Weston Brass, PharmD Referring MD: Tora Kindred MD: Tenny Craw MD, Gunnar Fusi Indication 1: Atrial Fibrillation Lab Used: [facility] Site: [facility] INR POC 2.0 INR RANGE 2 - 3  Dietary changes: no    Health status changes: no    Bleeding/hemorrhagic complications: yes       Details: pending hernia repair on 1/4.  Okay to hold Coumadin with no Lovenox bridge  Recent/future hospitalizations: no    Any changes in medication regimen? no    Recent/future dental: no  Any missed doses?: no       Is patient compliant with meds? yes       Allergies: 1)  ! * Theraflu  Anticoagulation Management History:      The patient is taking warfarin and comes in today for a routine follow up visit.  Positive risk factors for bleeding include an age of 70 years or older.  The bleeding index is 'intermediate risk'.  Positive CHADS2 values include History of HTN.  Negative CHADS2 values include Age > 16 years old.  The start date was 10/19/2008.  His last INR was 3.4.  Anticoagulation responsible provider: Tenny Craw MD, Gunnar Fusi.  INR POC: 2.0.  Cuvette Lot#: [id number].  Exp: 08/2011.    Anticoagulation Management Assessment/Plan:      The patient's current anticoagulation dose is Warfarin sodium 5 mg tabs: take as directed.  The target INR is 2 - 3.  The next INR is due 07/25/2010.  Anticoagulation instructions were given to patient/spouse.  Results were reviewed/authorized by Weston Brass, PharmD.  He was notified by Weston Brass PharmD.         Prior Anticoagulation Instructions: INR 2.6  Continue same dose of 1/2 tablet every day except 1 tablet on Monday, Wednesday and Friday.  Recheck INR in 2-3 weeks.  Current Anticoagulation Instructions: INR 2.0  Continue same dose of 1/2 tablet every day except 1 tablet on Monday, Wednesday and Friday.  Take last dose of Coumadin on 12/30.  Restart Coumadin at normal dose when okay with surgeon.

## 2010-08-11 NOTE — Cardiovascular Report (Signed)
Summary: Office Visit   Office Visit   Imported By: Roderic Ovens 06/23/2010 12:23:28  _____________________________________________________________________  External Attachment:    Type:   Image     Comment:   External Document

## 2010-08-11 NOTE — Progress Notes (Signed)
Summary: Holding Coumadin for procedure  ---- Converted from flag ---- ---- 06/22/2010 3:52 PM, Tereso Newcomer PA-C wrote: Phillip Heal He will need to hold his coumadin for his surgery on Jan 4. He does not need Lovenox. Thanks SW  ---- 06/22/2010 10:46 AM, Judie Grieve wrote: This pt surgery is on 07/13/10 at 10:50 am not 07/12/10 ------------------------------

## 2010-08-17 NOTE — Medication Information (Signed)
Summary: rov/tm  Anticoagulant Therapy  Managed by: Cloyde Reams, RN, BSN Referring MD: Tora Kindred MD: Antoine Poche MD, Fayrene Fearing Indication 1: Atrial Fibrillation Lab Used: [facility] Site: [facility] INR POC 2.4 INR RANGE 2 - 3  Dietary changes: no    Health status changes: no    Bleeding/hemorrhagic complications: no    Recent/future hospitalizations: no    Any changes in medication regimen? no    Recent/future dental: no  Any missed doses?: no       Is patient compliant with meds? yes       Allergies: 1)  ! * Theraflu  Anticoagulation Management History:      The patient is taking warfarin and comes in today for a routine follow up visit.  Positive risk factors for bleeding include an age of 10 years or older.  The bleeding index is 'intermediate risk'.  Positive CHADS2 values include History of HTN.  Negative CHADS2 values include Age > 37 years old.  The start date was 10/19/2008.  His last INR was 3.4.  Anticoagulation responsible provider: Antoine Poche MD, Fayrene Fearing.  INR POC: 2.4.  Cuvette Lot#: [id number].  Exp: 07/2011.    Anticoagulation Management Assessment/Plan:      The patient's current anticoagulation dose is Warfarin sodium 5 mg tabs: take as directed.  The target INR is 2 - 3.  The next INR is due 08/29/2010.  Anticoagulation instructions were given to patient/spouse.  Results were reviewed/authorized by Cloyde Reams, RN, BSN.  He was notified by Cloyde Reams RN.         Prior Anticoagulation Instructions: INR 1.6 Today take 1 1/2 pills then resume 1/2 pill everyday except 1 pill on Mondays, Wednesdays and Fridays. Recheck in 2 weeks.   Current Anticoagulation Instructions: INR 2.4  Continue on same dosage 1/2 tablet daily except 1 tablet on Mondays, Wednesdays, and Fridays.  Recheck in 3 weeks.

## 2010-08-18 DIAGNOSIS — I4891 Unspecified atrial fibrillation: Secondary | ICD-10-CM

## 2010-09-06 NOTE — Letter (Signed)
Summary: Evaluation for Perioprative Device Management  Evaluation for Perioprative Device Management   Imported By: Marylou Mccoy 08/30/2010 16:04:23  _____________________________________________________________________  External Attachment:    Type:   Image     Comment:   External Document

## 2010-09-21 ENCOUNTER — Encounter (INDEPENDENT_AMBULATORY_CARE_PROVIDER_SITE_OTHER): Payer: Medicare Other

## 2010-09-21 ENCOUNTER — Encounter: Payer: Self-pay | Admitting: Cardiology

## 2010-09-21 DIAGNOSIS — Z7901 Long term (current) use of anticoagulants: Secondary | ICD-10-CM

## 2010-09-21 DIAGNOSIS — I4891 Unspecified atrial fibrillation: Secondary | ICD-10-CM

## 2010-09-27 NOTE — Medication Information (Signed)
Summary: rov/ewj  Anticoagulant Therapy  Managed by: Bethena Midget, RN, BSN Referring MD: Tora Kindred MD: Riley Kill MD, Maisie Fus Indication 1: Atrial Fibrillation Lab Used: [facility] Site: [facility] INR POC 3.1 INR RANGE 2 - 3  Dietary changes: no    Health status changes: no    Bleeding/hemorrhagic complications: no    Recent/future hospitalizations: no    Any changes in medication regimen? no    Recent/future dental: no  Any missed doses?: no       Is patient compliant with meds? yes      Comments: pt verbalized that he missed his last appt. Today INR 3.1, attempted to schedule pt for 4 weeks but his spouse refused and only wanted the 5th week. Risks explained and discuss reasoning behind come at appropriate time esp. since INR is slightly elevated  Allergies: 1)  ! * Theraflu  Anticoagulation Management History:      The patient is taking warfarin and comes in today for a routine follow up visit.  Positive risk factors for bleeding include an age of 70 years or older.  The bleeding index is 'intermediate risk'.  Positive CHADS2 values include History of HTN.  Negative CHADS2 values include Age > 70 years old.  The start date was 10/19/2008.  His last INR was 3.4.  Anticoagulation responsible provider: Riley Kill MD, Maisie Fus.  INR POC: 3.1.  Cuvette Lot#: [id number].  Exp: 09/2011.    Anticoagulation Management Assessment/Plan:      The patient's current anticoagulation dose is Warfarin sodium 5 mg tabs: take as directed.  The target INR is 2 - 3.  The next INR is due 10/24/2010.  Anticoagulation instructions were given to patient/spouse.  Results were reviewed/authorized by Bethena Midget, RN, BSN.  He was notified by Bethena Midget, RN, BSN.         Prior Anticoagulation Instructions: INR 2.4  Continue on same dosage 1/2 tablet daily except 1 tablet on Mondays, Wednesdays, and Fridays.  Recheck in 3 weeks.    Current Anticoagulation Instructions: INR 3.1 Today only take  1/2 pill then resume 1/2 pill everyday except 1 pill on Mondays, Wednesdays and Fridays. Recheck in 4 weeks.

## 2010-10-24 ENCOUNTER — Encounter: Payer: Medicare Other | Admitting: *Deleted

## 2010-11-04 ENCOUNTER — Ambulatory Visit (INDEPENDENT_AMBULATORY_CARE_PROVIDER_SITE_OTHER): Payer: Medicare Other | Admitting: *Deleted

## 2010-11-04 DIAGNOSIS — I4891 Unspecified atrial fibrillation: Secondary | ICD-10-CM

## 2010-11-09 ENCOUNTER — Encounter: Payer: Self-pay | Admitting: Internal Medicine

## 2010-11-10 ENCOUNTER — Ambulatory Visit (INDEPENDENT_AMBULATORY_CARE_PROVIDER_SITE_OTHER): Payer: Medicare Other | Admitting: *Deleted

## 2010-11-10 ENCOUNTER — Ambulatory Visit (INDEPENDENT_AMBULATORY_CARE_PROVIDER_SITE_OTHER): Payer: Medicare Other | Admitting: Internal Medicine

## 2010-11-10 ENCOUNTER — Encounter: Payer: Self-pay | Admitting: Internal Medicine

## 2010-11-10 DIAGNOSIS — Z950 Presence of cardiac pacemaker: Secondary | ICD-10-CM

## 2010-11-10 DIAGNOSIS — I4891 Unspecified atrial fibrillation: Secondary | ICD-10-CM

## 2010-11-10 DIAGNOSIS — I10 Essential (primary) hypertension: Secondary | ICD-10-CM

## 2010-11-10 DIAGNOSIS — I495 Sick sinus syndrome: Secondary | ICD-10-CM

## 2010-11-10 NOTE — Patient Instructions (Signed)
Your physician wants you to follow-up in: 6 months in device clinic and 12 months with Dr Taylor You will receive a reminder letter in the mail two months in advance. If you don't receive a letter, please call our office to schedule the follow-up appointment.  

## 2010-11-10 NOTE — Progress Notes (Signed)
HPI Mr. Gaughan returns today for followup. He is a pleasant 70 year old man with paroxysmal atrial fibrillation, symptomatic bradycardia status post pacemaker insertion, and dyslipidemia. He also has hypertension. 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 He migrated to the [place] with his son and his wife many years ago. Not on File   Current Outpatient Prescriptions  Medication Sig Dispense Refill  . amiodarone (PACERONE) 200 MG tablet Take 200 mg by mouth daily.        Marland Kitchen amLODipine-benazepril (LOTREL) 10-20 MG per capsule Take 1 capsule by mouth daily.        Marland Kitchen esomeprazole (NEXIUM) 40 MG capsule Take 40 mg by mouth daily before breakfast.        . hydrochlorothiazide 25 MG tablet Take 25 mg by mouth daily.        . metoprolol (TOPROL-XL) 200 MG 24 hr tablet Take 200 mg by mouth daily.        Marland Kitchen omega-3 acid ethyl esters (LOVAZA) 1 G capsule 2 tabs po qd      . rosuvastatin (CRESTOR) 20 MG tablet Take 20 mg by mouth daily.        Marland Kitchen warfarin (COUMADIN) 5 MG tablet Take by mouth as directed.        Marland Kitchen DISCONTD: warfarin (COUMADIN) 5 MG tablet Take 5 mg by mouth daily.           Past Medical History  Diagnosis Date  . Nephrolithiasis   . Other and unspecified hyperlipidemia   . Hypertension   . Sinoatrial node dysfunction   . Atrial fibrillation     ROS:   All systems reviewed and negative except as noted in the HPI.   Past Surgical History  Procedure Date  . Insert / replace / remove pacemaker     guidant 10-06     No family history on file.   History   Social History  . Marital Status: Married    Spouse Name: N/A    Number of Children: N/A  . Years of Education: N/A   Occupational History  . retired Art gallery manager    Social History Main Topics  . Smoking status: Never Smoker   . Smokeless tobacco: Not on file  . Alcohol Use: Yes     minimal  . Drug Use: Not on file  . Sexually Active: Not on file   Other Topics Concern  . Not on file   Social  History Narrative  . No narrative on file     BP 127/80  Pulse 60  Wt 228 lb (103.42 kg)  Physical Exam:  Well appearing NAD HEENT: Unremarkable Neck:  No JVD, no thyromegally Lymphatics:  No adenopathy Back:  No CVA tenderness Lungs:  Clear. Well-healed pacemaker incision. HEART:  Regular rate rhythm, no rubs, no clicks. A soft systolic murmur is heard at the right upper sternal border. Abd:  Flat, positive bowel sounds, no organomegally, no rebound, no guarding Ext:  2 plus pulses, no edema, no cyanosis, no clubbing Skin:  No rashes no nodules Neuro:  CN II through XII intact, motor grossly intact.  DEVICE  Normal device function.  See PaceArt for details.   Assess/Plan:

## 2010-11-10 NOTE — Assessment & Plan Note (Addendum)
His symptoms appear to be well controlled. He appears to be in atrial fibrillation about a third of the time. He has very minimal palpitations and has had no syncope. He will continue his amiodarone at the current dose.

## 2010-11-10 NOTE — Assessment & Plan Note (Signed)
His blood pressure is well controlled today. He'll continue a low-sodium diet and his current medications. The patient admits to being sedentary. I've asked that he start walking on a daily basis.

## 2010-11-10 NOTE — Assessment & Plan Note (Signed)
His current device is working normally. He appears to have several years of battery longevity.

## 2010-11-24 ENCOUNTER — Ambulatory Visit (INDEPENDENT_AMBULATORY_CARE_PROVIDER_SITE_OTHER): Payer: Medicare Other | Admitting: *Deleted

## 2010-11-24 DIAGNOSIS — I4891 Unspecified atrial fibrillation: Secondary | ICD-10-CM

## 2010-11-24 LAB — POCT INR: INR: 2.1

## 2010-12-15 ENCOUNTER — Ambulatory Visit (INDEPENDENT_AMBULATORY_CARE_PROVIDER_SITE_OTHER): Payer: Medicare Other | Admitting: *Deleted

## 2010-12-15 DIAGNOSIS — I4891 Unspecified atrial fibrillation: Secondary | ICD-10-CM

## 2010-12-15 LAB — POCT INR: INR: 1.7

## 2010-12-23 ENCOUNTER — Other Ambulatory Visit: Payer: Self-pay | Admitting: Internal Medicine

## 2010-12-29 ENCOUNTER — Ambulatory Visit (INDEPENDENT_AMBULATORY_CARE_PROVIDER_SITE_OTHER): Payer: Medicare Other | Admitting: *Deleted

## 2010-12-29 DIAGNOSIS — I4891 Unspecified atrial fibrillation: Secondary | ICD-10-CM

## 2011-01-26 ENCOUNTER — Ambulatory Visit (INDEPENDENT_AMBULATORY_CARE_PROVIDER_SITE_OTHER): Payer: Medicare Other | Admitting: *Deleted

## 2011-01-26 DIAGNOSIS — I4891 Unspecified atrial fibrillation: Secondary | ICD-10-CM

## 2011-01-26 LAB — POCT INR: INR: 1.8

## 2011-02-16 ENCOUNTER — Ambulatory Visit (INDEPENDENT_AMBULATORY_CARE_PROVIDER_SITE_OTHER): Payer: Medicare Other | Admitting: *Deleted

## 2011-02-16 DIAGNOSIS — I4891 Unspecified atrial fibrillation: Secondary | ICD-10-CM

## 2011-02-16 LAB — POCT INR: INR: 2.8

## 2011-03-16 ENCOUNTER — Ambulatory Visit (INDEPENDENT_AMBULATORY_CARE_PROVIDER_SITE_OTHER): Payer: Medicare Other | Admitting: *Deleted

## 2011-03-16 DIAGNOSIS — I4891 Unspecified atrial fibrillation: Secondary | ICD-10-CM

## 2011-04-13 ENCOUNTER — Ambulatory Visit (INDEPENDENT_AMBULATORY_CARE_PROVIDER_SITE_OTHER): Payer: Medicare Other | Admitting: *Deleted

## 2011-04-13 DIAGNOSIS — I4891 Unspecified atrial fibrillation: Secondary | ICD-10-CM

## 2011-04-13 LAB — POCT INR: INR: 3

## 2011-05-11 ENCOUNTER — Ambulatory Visit (INDEPENDENT_AMBULATORY_CARE_PROVIDER_SITE_OTHER): Payer: Medicare Other | Admitting: *Deleted

## 2011-05-11 DIAGNOSIS — I4891 Unspecified atrial fibrillation: Secondary | ICD-10-CM

## 2011-05-11 LAB — POCT INR: INR: 2.5

## 2011-06-22 ENCOUNTER — Ambulatory Visit (INDEPENDENT_AMBULATORY_CARE_PROVIDER_SITE_OTHER): Payer: Medicare Other | Admitting: *Deleted

## 2011-06-22 ENCOUNTER — Encounter: Payer: Self-pay | Admitting: Internal Medicine

## 2011-06-22 DIAGNOSIS — I4891 Unspecified atrial fibrillation: Secondary | ICD-10-CM

## 2011-06-22 DIAGNOSIS — I495 Sick sinus syndrome: Secondary | ICD-10-CM

## 2011-06-22 LAB — PACEMAKER DEVICE OBSERVATION
AL IMPEDENCE PM: 760 Ohm
AL THRESHOLD: 1 V
RV LEAD AMPLITUDE: 10.3 mv
RV LEAD IMPEDENCE PM: 790 Ohm

## 2011-06-22 NOTE — Progress Notes (Signed)
PPM check 

## 2011-07-13 ENCOUNTER — Ambulatory Visit (INDEPENDENT_AMBULATORY_CARE_PROVIDER_SITE_OTHER): Payer: Medicare Other | Admitting: *Deleted

## 2011-07-13 DIAGNOSIS — I4891 Unspecified atrial fibrillation: Secondary | ICD-10-CM

## 2011-07-19 ENCOUNTER — Ambulatory Visit (INDEPENDENT_AMBULATORY_CARE_PROVIDER_SITE_OTHER): Payer: Medicare Other | Admitting: *Deleted

## 2011-07-19 DIAGNOSIS — I4891 Unspecified atrial fibrillation: Secondary | ICD-10-CM

## 2011-07-19 LAB — POCT INR: INR: 2.5

## 2011-07-20 ENCOUNTER — Telehealth: Payer: Self-pay | Admitting: Internal Medicine

## 2011-07-20 NOTE — Telephone Encounter (Signed)
New Msg: Pt wife calling to speak with nurse regarding pt bleeding nose. Please return pt wife call to discuss further.

## 2011-07-20 NOTE — Telephone Encounter (Signed)
Called because he has been having continued nose bleeds  His INR yesterday was 2.5  He was last seen by his PCP 3-4 months ago   Is due to see him again 08/03/11  It is Dr Arnetha Gula located in [place]  Was told to see Cardiologist for HTN  I checked his BP yesterday and his BP was 132/78.  But last night at the ER when he was having nose bleed his BP 177/103  She also made an appointment for his to see ENT for his nose bleeds.  Her name is Dr. Ardelle Anton in [place] 07/26/11  Will have her bring him in for an appointment tomorrow with PA

## 2011-07-21 ENCOUNTER — Other Ambulatory Visit: Payer: Medicare Other | Admitting: *Deleted

## 2011-07-21 ENCOUNTER — Ambulatory Visit (INDEPENDENT_AMBULATORY_CARE_PROVIDER_SITE_OTHER): Payer: Medicare Other | Admitting: Physician Assistant

## 2011-07-21 ENCOUNTER — Encounter: Payer: Self-pay | Admitting: Physician Assistant

## 2011-07-21 DIAGNOSIS — I4891 Unspecified atrial fibrillation: Secondary | ICD-10-CM

## 2011-07-21 DIAGNOSIS — R040 Epistaxis: Secondary | ICD-10-CM

## 2011-07-21 DIAGNOSIS — I10 Essential (primary) hypertension: Secondary | ICD-10-CM

## 2011-07-21 LAB — BASIC METABOLIC PANEL
BUN: 37 mg/dL — ABNORMAL HIGH (ref 6–23)
GFR: 39.49 mL/min — ABNORMAL LOW (ref >60.00)
Potassium: 3.7 mEq/L (ref 3.5–5.1)
Sodium: 141 mEq/L (ref 135–145)

## 2011-07-21 LAB — TSH: TSH: 4.08 u[IU]/mL (ref 0.35–5.50)

## 2011-07-21 NOTE — Patient Instructions (Signed)
Need to schedule an appt to come in for a nurse visit next week for b/p check.  -Bring your b/p machine with you to nurse visit.  -Monitor and record b/p readings and bring with you to nurse appt.  Your physician recommends that you schedule a follow-up appointment in: 3-4 weeks with Lewayne Bunting, MD.  Call if your blood pressure is greater than 140/90.  Your physician has requested that you have a renal artery duplex. During this test, an ultrasound is used to evaluate blood flow to the kidneys. Allow one hour for this exam. Do not eat after midnight the day before and avoid carbonated beverages. Take your medications as you usually do.   BMET and TSH today.  Complete 24hour urine.

## 2011-07-21 NOTE — Progress Notes (Deleted)
[address] [place], [place]  [address] Phone: [phone] Fax:  [phone]  Date:  07/21/2011   Name:  Jacob Skinner       DOB:  07/19/1940 MRN:  [id number]  PCP:  Dr. Radiontchenko Primary Cardiologist:  Dr. Gregg Taylor  Primary Electrophysiologist:  Dr. Gregg Taylor    History of Present Illness: Jacob Skinner is a 70 y.o. male who presents for follow up on BP.  He has a history of paroxysmal atrial fibrillation, symptomatic bradycardia, status post pacemaker insertion and hyperlipidemia and hypertension.  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  He was last seen by Dr. Taylor 5/12.  At that time, plan was for follow up in one year.  Two days ago, he went to the [facility] emergency room do to prolonged epistaxis.  This been ongoing for 3 days.  Prior to this, he noted a funny feeling in his head and he knew his blood pressure was going up.  He record his blood pressure at 170/100 at one point.  He had an episode of epistaxis some years ago.  It sounds as though he lost a considerable amount of blood.  He does note some flushing and palpitations associated with these symptoms.  His INR earlier this week was 2.5.  In the emergency room, his INR was 2.2 and his hemoglobin was 14.  A tampon was placed to stop the bleeding and he has a followup with an ENT to remove the device next week.  The patient denies chest pain, shortness of breath, syncope, orthopnea, PND or significant pedal edema.   Past Medical History  Diagnosis Date  . Nephrolithiasis   . Other and unspecified hyperlipidemia   . Hypertension   . Sinoatrial node dysfunction   . Atrial fibrillation     Current Outpatient Prescriptions  Medication Sig Dispense Refill  . amiodarone (PACERONE) 200 MG tablet TAKE 1 TABLET BY MOUTH EVERY DAY  90 tablet  0  . amLODipine-benazepril (LOTREL) 10-20 MG per capsule TAKE 1 CAPSULE BY MOUTH EVERY  DAY  90 capsule  0  . cephALEXin (KEFLEX) 500 MG capsule Take 500 mg by mouth every 6 (six) hours. For seven days.      . CRESTOR 20 MG tablet TAKE 1 TABLET BY MOUTH DAILY  90 tablet  0  . hydrochlorothiazide 25 MG tablet TAKE 1 TABLET BY MOUTH EVERY DAY  90 tablet  0  . metoprolol (TOPROL-XL) 200 MG 24 hr tablet TAKE 1 TABLET BY MOUTH EVERY DAY  90 tablet  0  . NEXIUM 40 MG capsule TAKE 1 CAPSULE BY MOUTH DAILY  90 capsule  0  . omega-3 acid ethyl esters (LOVAZA) 1 G capsule 4 tabs po qd      . warfarin (COUMADIN) 5 MG tablet TAKE AS DIRECTED  90 tablet  0    Allergies: Allergies  Allergen Reactions  . Theraflu Flu &     History  Substance Use Topics  . Smoking status: Never Smoker   . Smokeless tobacco: Not on file  . Alcohol Use: Yes     minimal     ROS:  Please see the history of present illness.   He had a recent illness with fevers and cough.  This is resolved.  He did not use any over-the-counter cold medications.  All other systems reviewed and negative.   PHYSICAL EXAM: VS:  BP 120/76    Pulse 60  Resp 20  Ht 5' 10" (1.778 m)  Wt 230 lb (104.327 kg)  BMI 33.00 kg/m2 Repeat blood pressure by me: Left 130/80, right 120/70 Well nourished, well developed, in no acute distress HEENT: normal Neck: no JVD Vascular: No carotid bruits Cardiac:  normal S1, S2; RRR; no murmur Lungs:  clear to auscultation bilaterally, no wheezing, rhonchi or rales Abd: soft, nontender, no hepatomegaly Ext: no edema Skin: warm and dry Neuro:  CNs 2-12 intact, no focal abnormalities noted Psych: Normal affect  EKG:   Atrial paced, heart rate 60, left axis deviation, nonspecific ST-T wave changes  ASSESSMENT AND PLAN:  

## 2011-07-21 NOTE — Assessment & Plan Note (Addendum)
His blood pressure is well controlled today.  He could tell that his blood pressure was going up.  He did not change his medications or take anything new that would have driven his blood pressure up.  He did have some symptoms that sound like flushing and palpitations.  He had epistaxis associated.  He had another episode like this several years ago.  He did have a recent viral illness that may have been partially responsible for elevations in his BP. 1. Return next week for blood pressure check with the nurse 2. Monitor blood pressure at home twice a day and record.  He will bring in a list of his blood pressures to his next appointment. 3. Check a basic metabolic panel and TSH today. 4. Arrange 24-hour urine collection for VMA, metanephrines, catecholamines. 5. Renal artery ultrasound to rule out renal artery stenosis. 6. Followup with Dr. Ladona Ridgel in 3 or 4 weeks.

## 2011-07-21 NOTE — Progress Notes (Signed)
[address] [place], [place]  [address] Phone: [phone] Fax:  [phone]  Date:  07/21/2011   Name:  Jacob Skinner       DOB:  12/26/1940 MRN:  [id number]  PCP:  Dr. Johnney Killian Primary Cardiologist:  Dr. Lewayne Bunting  Primary Electrophysiologist:  Dr. Lewayne Bunting    History of Present Illness: Jacob Skinner is a 71 y.o. male who presents for follow up on BP.  He has a history of paroxysmal atrial fibrillation, symptomatic bradycardia, status post pacemaker insertion and hyperlipidemia and hypertension.  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  He was last seen by Dr. Ladona Ridgel 5/12.  At that time, plan was for follow up in one year.  Two days ago, he went to the [facility] emergency room do to prolonged epistaxis.  This been ongoing for 3 days.  Prior to this, he noted a funny feeling in his head and he knew his blood pressure was going up.  He record his blood pressure at 170/100 at one point.  He had an episode of epistaxis some years ago.  It sounds as though he lost a considerable amount of blood.  He does note some flushing and palpitations associated with these symptoms.  His INR earlier this week was 2.5.  In the emergency room, his INR was 2.2 and his hemoglobin was 14.  A tampon was placed to stop the bleeding and he has a followup with an ENT to remove the device next week.  The patient denies chest pain, shortness of breath, syncope, orthopnea, PND or significant pedal edema.   Past Medical History  Diagnosis Date  . Nephrolithiasis   . Other and unspecified hyperlipidemia   . Hypertension   . Sinoatrial node dysfunction   . Atrial fibrillation     Current Outpatient Prescriptions  Medication Sig Dispense Refill  . amiodarone (PACERONE) 200 MG tablet TAKE 1 TABLET BY MOUTH EVERY DAY  90 tablet  0  . amLODipine-benazepril (LOTREL) 10-20 MG per capsule TAKE 1 CAPSULE BY MOUTH EVERY  DAY  90 capsule  0  . cephALEXin (KEFLEX) 500 MG capsule Take 500 mg by mouth every 6 (six) hours. For seven days.      . CRESTOR 20 MG tablet TAKE 1 TABLET BY MOUTH DAILY  90 tablet  0  . hydrochlorothiazide 25 MG tablet TAKE 1 TABLET BY MOUTH EVERY DAY  90 tablet  0  . metoprolol (TOPROL-XL) 200 MG 24 hr tablet TAKE 1 TABLET BY MOUTH EVERY DAY  90 tablet  0  . NEXIUM 40 MG capsule TAKE 1 CAPSULE BY MOUTH DAILY  90 capsule  0  . omega-3 acid ethyl esters (LOVAZA) 1 G capsule 4 tabs po qd      . warfarin (COUMADIN) 5 MG tablet TAKE AS DIRECTED  90 tablet  0    Allergies: Allergies  Allergen Reactions  . Theraflu Flu &     History  Substance Use Topics  . Smoking status: Never Smoker   . Smokeless tobacco: Not on file  . Alcohol Use: Yes     minimal     ROS:  Please see the history of present illness.   He had a recent illness with fevers and cough.  This is resolved.  He did not use any over-the-counter cold medications.  All other systems reviewed and negative.   PHYSICAL EXAM: VS:  BP 120/76  Pulse 60  Resp 20  Ht 5\' 10"  (1.778 m)  Wt 230 lb (104.327 kg)  BMI 33.00 kg/m2 Repeat blood pressure by me: Left 130/80, right 120/70 Well nourished, well developed, in no acute distress HEENT: normal Neck: no JVD Vascular: No carotid bruits Cardiac:  normal S1, S2; RRR; no murmur Lungs:  clear to auscultation bilaterally, no wheezing, rhonchi or rales Abd: soft, nontender, no hepatomegaly Ext: no edema Skin: warm and dry Neuro:  CNs 2-12 intact, no focal abnormalities noted Psych: Normal affect  EKG:   Atrial paced, heart rate 60, left axis deviation, nonspecific ST-T wave changes  ASSESSMENT AND PLAN:

## 2011-07-21 NOTE — Assessment & Plan Note (Signed)
I will refer him to ENT for formal consultation regarding his recurrent epistaxis.

## 2011-07-21 NOTE — Assessment & Plan Note (Signed)
He has paroxysmal atrial fibrillation.  He has now had a second episode of epistaxis.  He has a history of hypertension and states that he is borderline diabetic.  I will bring him back in 3-4 weeks with Dr. Ladona Ridgel as noted.  If he continues to have significant bleeding, we may need to reconsider his candidacy for Coumadin.  At this point, I would not stop.

## 2011-07-27 ENCOUNTER — Ambulatory Visit (INDEPENDENT_AMBULATORY_CARE_PROVIDER_SITE_OTHER): Payer: Medicare Other

## 2011-07-27 ENCOUNTER — Other Ambulatory Visit: Payer: Medicare Other | Admitting: *Deleted

## 2011-07-27 VITALS — BP 130/70 | HR 68 | Resp 18 | Wt 230.0 lb

## 2011-07-27 DIAGNOSIS — I10 Essential (primary) hypertension: Secondary | ICD-10-CM

## 2011-08-01 LAB — CATECHOLAMINES, FRACTIONATED, URINE, 24 HOUR
Calculated Total (E+NE): 23 mcg/24 h — ABNORMAL LOW (ref 26–121)
Creatinine, Urine mg/day-CATEUR: 0.83 g/(24.h) (ref 0.63–2.50)
Norepinephrine, 24 hr Ur: 23 mcg/24 h (ref 15–100)

## 2011-08-02 LAB — VMA, URINE, 24 HOUR: Vanillylmandelic Acid, (VMA): 2.5 mg/24 h (ref <=6.0)

## 2011-08-03 ENCOUNTER — Other Ambulatory Visit: Payer: Self-pay | Admitting: Pharmacist

## 2011-08-03 ENCOUNTER — Encounter: Payer: Medicare Other | Admitting: *Deleted

## 2011-08-03 DIAGNOSIS — I4891 Unspecified atrial fibrillation: Secondary | ICD-10-CM

## 2011-08-03 DIAGNOSIS — Z7901 Long term (current) use of anticoagulants: Secondary | ICD-10-CM

## 2011-08-07 ENCOUNTER — Encounter (INDEPENDENT_AMBULATORY_CARE_PROVIDER_SITE_OTHER): Payer: Medicare Other | Admitting: Cardiology

## 2011-08-07 ENCOUNTER — Other Ambulatory Visit: Payer: Self-pay | Admitting: Cardiology

## 2011-08-07 DIAGNOSIS — I10 Essential (primary) hypertension: Secondary | ICD-10-CM

## 2011-08-16 ENCOUNTER — Encounter: Payer: Medicare Other | Admitting: *Deleted

## 2011-08-21 ENCOUNTER — Ambulatory Visit (INDEPENDENT_AMBULATORY_CARE_PROVIDER_SITE_OTHER): Payer: Medicare Other | Admitting: *Deleted

## 2011-08-21 ENCOUNTER — Encounter: Payer: Self-pay | Admitting: Internal Medicine

## 2011-08-21 ENCOUNTER — Ambulatory Visit (INDEPENDENT_AMBULATORY_CARE_PROVIDER_SITE_OTHER): Payer: Medicare Other | Admitting: Internal Medicine

## 2011-08-21 VITALS — BP 120/68 | HR 73 | Ht 70.0 in | Wt 200.0 lb

## 2011-08-21 DIAGNOSIS — Z7901 Long term (current) use of anticoagulants: Secondary | ICD-10-CM

## 2011-08-21 DIAGNOSIS — Z950 Presence of cardiac pacemaker: Secondary | ICD-10-CM

## 2011-08-21 DIAGNOSIS — R040 Epistaxis: Secondary | ICD-10-CM

## 2011-08-21 DIAGNOSIS — I4891 Unspecified atrial fibrillation: Secondary | ICD-10-CM

## 2011-08-21 DIAGNOSIS — I10 Essential (primary) hypertension: Secondary | ICD-10-CM

## 2011-08-21 LAB — POCT INR: INR: 1.9

## 2011-08-21 NOTE — Patient Instructions (Signed)
Your physician wants you to follow-up in: 6 months  You will receive a reminder letter in the mail two months in advance. If you don't receive a letter, please call our office to schedule the follow-up appointment.  Your physician recommends that you continue on your current medications as directed. Please refer to the Current Medication list given to you today.  

## 2011-08-22 ENCOUNTER — Encounter: Payer: Self-pay | Admitting: Internal Medicine

## 2011-08-22 NOTE — Assessment & Plan Note (Signed)
His device is working normally. We'll plan to recheck in several months. 

## 2011-08-22 NOTE — Assessment & Plan Note (Signed)
He has had no recurrent episodes. I've discussed the importance of keeping the blood pressure controlled and not allowing his INR to become too high.

## 2011-08-22 NOTE — Assessment & Plan Note (Signed)
He is asymptomatic despite being out of rhythm at times. He will continue amiodarone.

## 2011-08-22 NOTE — Assessment & Plan Note (Signed)
His blood pressure is well controlled today. He will continue his medical therapy and maintain a low-sodium diet.

## 2011-08-22 NOTE — Progress Notes (Signed)
HPI [name] returns today for followup. He is a very pleasant 71 year old man with a history of atrial fibrillation, symptomatic bradycardia, status post permanent pacemaker insertion. He also has hypertension. The patient recently went to the emergency room with epistaxis. Ultimately his nosebleed was contained. He has had no recurrent bleeds. His INR was in the mid twos. He denies chest pain, shortness of breath, or peripheral edema. Allergies  Allergen Reactions  . Theraflu Flu &      Current Outpatient Prescriptions  Medication Sig Dispense Refill  . amiodarone (PACERONE) 200 MG tablet TAKE 1 TABLET BY MOUTH EVERY DAY  90 tablet  0  . amLODipine-benazepril (LOTREL) 10-20 MG per capsule TAKE 1 CAPSULE BY MOUTH EVERY DAY  90 capsule  0  . cephALEXin (KEFLEX) 500 MG capsule Take 500 mg by mouth every 6 (six) hours. For seven days.      . CRESTOR 20 MG tablet TAKE 1 TABLET BY MOUTH DAILY  90 tablet  0  . hydrochlorothiazide 25 MG tablet TAKE 1 TABLET BY MOUTH EVERY DAY  90 tablet  0  . metoprolol (TOPROL-XL) 200 MG 24 hr tablet TAKE 1 TABLET BY MOUTH EVERY DAY  90 tablet  0  . NEXIUM 40 MG capsule TAKE 1 CAPSULE BY MOUTH DAILY  90 capsule  0  . omega-3 acid ethyl esters (LOVAZA) 1 G capsule 4 tabs po qd      . warfarin (COUMADIN) 5 MG tablet TAKE AS DIRECTED  90 tablet  0     Past Medical History  Diagnosis Date  . Nephrolithiasis   . Other and unspecified hyperlipidemia   . Hypertension   . Sinoatrial node dysfunction   . Atrial fibrillation     ROS:   All systems reviewed and negative except as noted in the HPI.   Past Surgical History  Procedure Date  . Insert / replace / remove pacemaker     guidant 10-06     No family history on file.   History   Social History  . Marital Status: Married    Spouse Name: N/A    Number of Children: N/A  . Years of Education: N/A   Occupational History  . retired Art gallery manager    Social History Main Topics  . Smoking  status: Never Smoker   . Smokeless tobacco: Not on file  . Alcohol Use: Yes     minimal  . Drug Use: Not on file  . Sexually Active: Not on file   Other Topics Concern  . Not on file   Social History Narrative  . No narrative on file     BP 120/68  Pulse 73  Ht 5\' 10"  (1.778 m)  Wt 90.719 kg (200 lb)  BMI 28.70 kg/m2  SpO2 96%  Physical Exam:  Well appearing 71 year old man, NAD HEENT: Unremarkable Neck:  No JVD, no thyromegally Lungs:  Clear with no wheezes, rales, or rhonchi. HEART:  Regular rate rhythm, no murmurs, no rubs, no clicks Abd:  soft, positive bowel sounds, no organomegally, no rebound, no guarding Ext:  2 plus pulses, no edema, no cyanosis, no clubbing Skin:  No rashes no nodules Neuro:  CN II through XII intact, motor grossly intact  DEVICE  Normal device function.  See PaceArt for details. Previous interrogation demonstrates normal function.  Assess/Plan:

## 2011-09-18 ENCOUNTER — Ambulatory Visit (INDEPENDENT_AMBULATORY_CARE_PROVIDER_SITE_OTHER): Payer: Medicare Other | Admitting: *Deleted

## 2011-09-18 DIAGNOSIS — Z7901 Long term (current) use of anticoagulants: Secondary | ICD-10-CM

## 2011-09-18 DIAGNOSIS — I4891 Unspecified atrial fibrillation: Secondary | ICD-10-CM

## 2011-10-16 ENCOUNTER — Ambulatory Visit (INDEPENDENT_AMBULATORY_CARE_PROVIDER_SITE_OTHER): Payer: Medicare Other

## 2011-10-16 DIAGNOSIS — Z7901 Long term (current) use of anticoagulants: Secondary | ICD-10-CM

## 2011-10-16 DIAGNOSIS — I4891 Unspecified atrial fibrillation: Secondary | ICD-10-CM

## 2011-11-14 ENCOUNTER — Ambulatory Visit (INDEPENDENT_AMBULATORY_CARE_PROVIDER_SITE_OTHER): Payer: Medicare Other | Admitting: *Deleted

## 2011-11-14 DIAGNOSIS — Z7901 Long term (current) use of anticoagulants: Secondary | ICD-10-CM

## 2011-11-14 DIAGNOSIS — I4891 Unspecified atrial fibrillation: Secondary | ICD-10-CM

## 2011-12-26 ENCOUNTER — Ambulatory Visit (INDEPENDENT_AMBULATORY_CARE_PROVIDER_SITE_OTHER): Payer: Medicare Other | Admitting: Pharmacist

## 2011-12-26 DIAGNOSIS — Z7901 Long term (current) use of anticoagulants: Secondary | ICD-10-CM

## 2011-12-26 DIAGNOSIS — I4891 Unspecified atrial fibrillation: Secondary | ICD-10-CM

## 2012-01-25 ENCOUNTER — Encounter: Payer: Self-pay | Admitting: Internal Medicine

## 2012-02-14 ENCOUNTER — Ambulatory Visit (INDEPENDENT_AMBULATORY_CARE_PROVIDER_SITE_OTHER): Payer: Medicare Other | Admitting: Pharmacist

## 2012-02-14 DIAGNOSIS — I4891 Unspecified atrial fibrillation: Secondary | ICD-10-CM

## 2012-02-14 DIAGNOSIS — Z7901 Long term (current) use of anticoagulants: Secondary | ICD-10-CM

## 2012-02-20 ENCOUNTER — Other Ambulatory Visit: Payer: Self-pay | Admitting: Internal Medicine

## 2012-02-27 ENCOUNTER — Ambulatory Visit (INDEPENDENT_AMBULATORY_CARE_PROVIDER_SITE_OTHER): Payer: Medicare Other | Admitting: Internal Medicine

## 2012-02-27 ENCOUNTER — Ambulatory Visit (INDEPENDENT_AMBULATORY_CARE_PROVIDER_SITE_OTHER): Payer: Medicare Other | Admitting: Pharmacist

## 2012-02-27 ENCOUNTER — Encounter: Payer: Self-pay | Admitting: Internal Medicine

## 2012-02-27 VITALS — BP 130/84 | HR 64 | Ht 70.0 in | Wt 238.1 lb

## 2012-02-27 DIAGNOSIS — Z950 Presence of cardiac pacemaker: Secondary | ICD-10-CM

## 2012-02-27 DIAGNOSIS — I4891 Unspecified atrial fibrillation: Secondary | ICD-10-CM

## 2012-02-27 DIAGNOSIS — I10 Essential (primary) hypertension: Secondary | ICD-10-CM

## 2012-02-27 DIAGNOSIS — I495 Sick sinus syndrome: Secondary | ICD-10-CM

## 2012-02-27 DIAGNOSIS — Z7901 Long term (current) use of anticoagulants: Secondary | ICD-10-CM

## 2012-02-27 LAB — PACEMAKER DEVICE OBSERVATION
AL AMPLITUDE: 0.75 mv
BAMS-0001: 170 {beats}/min
RV LEAD AMPLITUDE: 9.3 mv
RV LEAD IMPEDENCE PM: 790 Ohm
VENTRICULAR PACING PM: 48

## 2012-02-27 LAB — POCT INR: INR: 1.8

## 2012-02-27 NOTE — Progress Notes (Signed)
HPI Jacob Skinner returns today for followup. He is a very pleasant 71 year old man with a history of paroxysmal atrial fibrillation, symptomatic bradycardia, status post permanent pacemaker insertion, hypertension, and dyslipidemia. In the interim, he has been stable. He has not been hospitalized and he denies chest pain or shortness of breath. No peripheral edema and no palpitations to speak of. He has had no syncope. Allergies  Allergen Reactions  . Pheniramine-Pe-Apap      Current Outpatient Prescriptions  Medication Sig Dispense Refill  . amiodarone (PACERONE) 200 MG tablet TAKE 1 TABLET BY MOUTH EVERY DAY  90 tablet  0  . amLODipine-benazepril (LOTREL) 10-20 MG per capsule TAKE 1 CAPSULE BY MOUTH EVERY DAY  90 capsule  0  . CRESTOR 20 MG tablet TAKE 1 TABLET BY MOUTH DAILY  90 tablet  0  . hydrochlorothiazide (HYDRODIURIL) 25 MG tablet TAKE 1 TABLET BY MOUTH EVERY DAY  90 tablet  0  . metoprolol (TOPROL-XL) 200 MG 24 hr tablet TAKE 1 TABLET BY MOUTH EVERY DAY  90 tablet  0  . NEXIUM 40 MG capsule TAKE 1 CAPSULE BY MOUTH DAILY  90 capsule  0  . omega-3 acid ethyl esters (LOVAZA) 1 G capsule 4 tabs po qd      . warfarin (COUMADIN) 5 MG tablet TAKE AS DIRECTED  90 tablet  0     Past Medical History  Diagnosis Date  . Nephrolithiasis   . Other and unspecified hyperlipidemia   . Hypertension   . Sinoatrial node dysfunction   . Atrial fibrillation     ROS:   All systems reviewed and negative except as noted in the HPI.   Past Surgical History  Procedure Date  . Insert / replace / remove pacemaker     guidant 10-06     No family history on file.   History   Social History  . Marital Status: Married    Spouse Name: N/A    Number of Children: N/A  . Years of Education: N/A   Occupational History  . retired Art gallery manager    Social History Main Topics  . Smoking status: Never Smoker   . Smokeless tobacco: Not on file  . Alcohol Use: Yes     minimal  . Drug Use: Not  on file  . Sexually Active: Not on file   Other Topics Concern  . Not on file   Social History Narrative  . No narrative on file     BP 130/84  Pulse 64  Ht 5\' 10"  (1.778 m)  Wt 238 lb 1.9 oz (108.011 kg)  BMI 34.17 kg/m2  Physical Exam:  Well appearing 71 year old man, NAD HEENT: Unremarkable Neck:  No JVD, no thyromegally Lungs:  Clear with no wheezes, rales, or rhonchi. HEART:  Regular rate rhythm, no murmurs, no rubs, no clicks Abd:  soft, positive bowel sounds, no organomegally, no rebound, no guarding Ext:  2 plus pulses, no edema, no cyanosis, no clubbing Skin:  No rashes no nodules Neuro:  CN II through XII intact, motor grossly intact  DEVICE  Normal device function.  See PaceArt for details.   Assess/Plan:

## 2012-02-27 NOTE — Patient Instructions (Addendum)
Your physician wants you to follow-up in: 6 months with device clinic and 12 months with Dr Taylor You will receive a reminder letter in the mail two months in advance. If you don't receive a letter, please call our office to schedule the follow-up appointment.  

## 2012-02-27 NOTE — Assessment & Plan Note (Signed)
His pacemaker is working normally. Battery longevity is still good. We'll recheck in several months.

## 2012-02-27 NOTE — Assessment & Plan Note (Signed)
The patient has had breakthroughs of atrial fibrillation but has minimal symptoms. He will continue his current medical therapy.

## 2012-02-27 NOTE — Assessment & Plan Note (Signed)
His blood pressure is reasonably well controlled. He will continue his current medical therapy, reduce his salt intake, and exercise more. I've encouraged the patient to walk daily.

## 2012-03-13 ENCOUNTER — Ambulatory Visit (INDEPENDENT_AMBULATORY_CARE_PROVIDER_SITE_OTHER): Payer: Medicare Other | Admitting: *Deleted

## 2012-03-13 DIAGNOSIS — I4891 Unspecified atrial fibrillation: Secondary | ICD-10-CM

## 2012-03-13 DIAGNOSIS — Z7901 Long term (current) use of anticoagulants: Secondary | ICD-10-CM

## 2012-03-13 LAB — POCT INR: INR: 2.4

## 2012-03-27 ENCOUNTER — Other Ambulatory Visit: Payer: Self-pay | Admitting: Internal Medicine

## 2012-04-03 ENCOUNTER — Ambulatory Visit (INDEPENDENT_AMBULATORY_CARE_PROVIDER_SITE_OTHER): Payer: Medicare Other | Admitting: *Deleted

## 2012-04-03 DIAGNOSIS — Z7901 Long term (current) use of anticoagulants: Secondary | ICD-10-CM

## 2012-04-03 DIAGNOSIS — I4891 Unspecified atrial fibrillation: Secondary | ICD-10-CM

## 2012-05-08 ENCOUNTER — Ambulatory Visit (INDEPENDENT_AMBULATORY_CARE_PROVIDER_SITE_OTHER): Payer: Medicare Other | Admitting: *Deleted

## 2012-05-08 DIAGNOSIS — Z7901 Long term (current) use of anticoagulants: Secondary | ICD-10-CM

## 2012-05-08 DIAGNOSIS — I4891 Unspecified atrial fibrillation: Secondary | ICD-10-CM

## 2012-05-08 LAB — POCT INR: INR: 2.2

## 2012-05-15 ENCOUNTER — Other Ambulatory Visit: Payer: Self-pay | Admitting: Internal Medicine

## 2012-05-27 ENCOUNTER — Ambulatory Visit (INDEPENDENT_AMBULATORY_CARE_PROVIDER_SITE_OTHER): Payer: Medicare Other | Admitting: *Deleted

## 2012-05-27 DIAGNOSIS — Z7901 Long term (current) use of anticoagulants: Secondary | ICD-10-CM

## 2012-05-27 DIAGNOSIS — I4891 Unspecified atrial fibrillation: Secondary | ICD-10-CM

## 2012-05-27 LAB — POCT INR: INR: 2.5

## 2012-06-24 ENCOUNTER — Ambulatory Visit (INDEPENDENT_AMBULATORY_CARE_PROVIDER_SITE_OTHER): Payer: Medicare Other | Admitting: *Deleted

## 2012-06-24 DIAGNOSIS — I4891 Unspecified atrial fibrillation: Secondary | ICD-10-CM

## 2012-06-24 DIAGNOSIS — Z7901 Long term (current) use of anticoagulants: Secondary | ICD-10-CM

## 2012-07-23 ENCOUNTER — Ambulatory Visit (INDEPENDENT_AMBULATORY_CARE_PROVIDER_SITE_OTHER): Payer: Medicare Other | Admitting: *Deleted

## 2012-07-23 DIAGNOSIS — Z7901 Long term (current) use of anticoagulants: Secondary | ICD-10-CM

## 2012-07-23 DIAGNOSIS — I4891 Unspecified atrial fibrillation: Secondary | ICD-10-CM

## 2012-08-16 ENCOUNTER — Encounter: Payer: Self-pay | Admitting: Internal Medicine

## 2012-08-19 ENCOUNTER — Other Ambulatory Visit: Payer: Self-pay | Admitting: Internal Medicine

## 2012-08-21 ENCOUNTER — Other Ambulatory Visit: Payer: Self-pay | Admitting: Internal Medicine

## 2012-09-05 ENCOUNTER — Ambulatory Visit (INDEPENDENT_AMBULATORY_CARE_PROVIDER_SITE_OTHER): Payer: Medicare Other | Admitting: *Deleted

## 2012-09-05 ENCOUNTER — Other Ambulatory Visit: Payer: Self-pay | Admitting: Internal Medicine

## 2012-09-05 DIAGNOSIS — I4891 Unspecified atrial fibrillation: Secondary | ICD-10-CM

## 2012-09-05 DIAGNOSIS — Z7901 Long term (current) use of anticoagulants: Secondary | ICD-10-CM

## 2012-09-05 DIAGNOSIS — I495 Sick sinus syndrome: Secondary | ICD-10-CM

## 2012-09-05 LAB — PACEMAKER DEVICE OBSERVATION
AL IMPEDENCE PM: 790 Ohm
AL THRESHOLD: 0.9 V
BAMS-0002: 8 ms
DEVICE MODEL PM: 129438
RV LEAD AMPLITUDE: 10.3 mv
RV LEAD THRESHOLD: 1.3 V

## 2012-09-05 LAB — POCT INR: INR: 3.2

## 2012-09-05 NOTE — Progress Notes (Signed)
PPM check 

## 2012-09-23 ENCOUNTER — Encounter: Payer: Self-pay | Admitting: Internal Medicine

## 2012-10-03 ENCOUNTER — Ambulatory Visit (INDEPENDENT_AMBULATORY_CARE_PROVIDER_SITE_OTHER): Payer: Medicare Other | Admitting: *Deleted

## 2012-10-03 DIAGNOSIS — I4891 Unspecified atrial fibrillation: Secondary | ICD-10-CM

## 2012-10-03 DIAGNOSIS — Z7901 Long term (current) use of anticoagulants: Secondary | ICD-10-CM

## 2012-10-31 ENCOUNTER — Ambulatory Visit (INDEPENDENT_AMBULATORY_CARE_PROVIDER_SITE_OTHER): Payer: Medicare Other | Admitting: Pharmacist

## 2012-10-31 DIAGNOSIS — I4891 Unspecified atrial fibrillation: Secondary | ICD-10-CM

## 2012-10-31 DIAGNOSIS — Z7901 Long term (current) use of anticoagulants: Secondary | ICD-10-CM

## 2012-11-16 ENCOUNTER — Other Ambulatory Visit: Payer: Self-pay | Admitting: Internal Medicine

## 2012-11-28 ENCOUNTER — Ambulatory Visit (INDEPENDENT_AMBULATORY_CARE_PROVIDER_SITE_OTHER): Payer: Medicare Other

## 2012-11-28 DIAGNOSIS — I4891 Unspecified atrial fibrillation: Secondary | ICD-10-CM

## 2012-11-28 DIAGNOSIS — Z7901 Long term (current) use of anticoagulants: Secondary | ICD-10-CM

## 2012-11-28 LAB — POCT INR: INR: 3.3

## 2012-12-18 ENCOUNTER — Ambulatory Visit (INDEPENDENT_AMBULATORY_CARE_PROVIDER_SITE_OTHER): Payer: Medicare Other | Admitting: *Deleted

## 2012-12-18 DIAGNOSIS — Z7901 Long term (current) use of anticoagulants: Secondary | ICD-10-CM

## 2012-12-18 DIAGNOSIS — I4891 Unspecified atrial fibrillation: Secondary | ICD-10-CM

## 2013-01-01 ENCOUNTER — Encounter: Payer: Self-pay | Admitting: Internal Medicine

## 2013-01-15 ENCOUNTER — Ambulatory Visit (INDEPENDENT_AMBULATORY_CARE_PROVIDER_SITE_OTHER): Payer: Medicare Other | Admitting: *Deleted

## 2013-01-15 DIAGNOSIS — Z7901 Long term (current) use of anticoagulants: Secondary | ICD-10-CM

## 2013-01-15 DIAGNOSIS — I4891 Unspecified atrial fibrillation: Secondary | ICD-10-CM

## 2013-01-30 ENCOUNTER — Ambulatory Visit (INDEPENDENT_AMBULATORY_CARE_PROVIDER_SITE_OTHER): Payer: Medicare Other | Admitting: *Deleted

## 2013-01-30 DIAGNOSIS — I4891 Unspecified atrial fibrillation: Secondary | ICD-10-CM

## 2013-01-30 DIAGNOSIS — Z7901 Long term (current) use of anticoagulants: Secondary | ICD-10-CM

## 2013-02-04 ENCOUNTER — Encounter: Payer: Self-pay | Admitting: Internal Medicine

## 2013-02-17 ENCOUNTER — Other Ambulatory Visit: Payer: Self-pay | Admitting: Internal Medicine

## 2013-02-21 ENCOUNTER — Ambulatory Visit (INDEPENDENT_AMBULATORY_CARE_PROVIDER_SITE_OTHER): Payer: Medicare Other | Admitting: Pharmacist

## 2013-02-21 DIAGNOSIS — I4891 Unspecified atrial fibrillation: Secondary | ICD-10-CM

## 2013-02-21 DIAGNOSIS — Z7901 Long term (current) use of anticoagulants: Secondary | ICD-10-CM

## 2013-03-12 NOTE — Telephone Encounter (Signed)
Send to PCP.

## 2013-03-21 ENCOUNTER — Encounter: Payer: Medicare Other | Admitting: Internal Medicine

## 2013-03-24 ENCOUNTER — Ambulatory Visit (INDEPENDENT_AMBULATORY_CARE_PROVIDER_SITE_OTHER): Payer: Medicare Other | Admitting: Pharmacist

## 2013-03-24 DIAGNOSIS — Z7901 Long term (current) use of anticoagulants: Secondary | ICD-10-CM

## 2013-03-24 DIAGNOSIS — I4891 Unspecified atrial fibrillation: Secondary | ICD-10-CM

## 2013-03-24 LAB — POCT INR: INR: 2.1

## 2013-04-08 ENCOUNTER — Ambulatory Visit (INDEPENDENT_AMBULATORY_CARE_PROVIDER_SITE_OTHER): Payer: Medicare Other

## 2013-04-08 ENCOUNTER — Encounter: Payer: Self-pay | Admitting: Internal Medicine

## 2013-04-08 ENCOUNTER — Ambulatory Visit (INDEPENDENT_AMBULATORY_CARE_PROVIDER_SITE_OTHER): Payer: Medicare Other | Admitting: Internal Medicine

## 2013-04-08 VITALS — BP 118/77 | HR 71 | Ht 70.0 in | Wt 236.4 lb

## 2013-04-08 DIAGNOSIS — I4891 Unspecified atrial fibrillation: Secondary | ICD-10-CM

## 2013-04-08 DIAGNOSIS — Z950 Presence of cardiac pacemaker: Secondary | ICD-10-CM

## 2013-04-08 DIAGNOSIS — I495 Sick sinus syndrome: Secondary | ICD-10-CM

## 2013-04-08 DIAGNOSIS — Z7901 Long term (current) use of anticoagulants: Secondary | ICD-10-CM

## 2013-04-08 LAB — POCT INR: INR: 2.7

## 2013-04-08 LAB — PACEMAKER DEVICE OBSERVATION
ATRIAL PACING PM: 70
BRDY-0002RV: 60 {beats}/min
BRDY-0004RV: 120 {beats}/min
DEVICE MODEL PM: 129438

## 2013-04-08 NOTE — Patient Instructions (Addendum)
Your physician wants you to follow-up in: 6 months in device clinic and 12 months with Dr Court Joy will receive a reminder letter in the mail two months in advance. If you don't receive a letter, please call our office to schedule the follow-up appointment.   Your physician has recommended you make the following change in your medication:  1) Take an extra 1/2 of Metoprolol in the pm if blood pressure is greater than 150

## 2013-04-08 NOTE — Assessment & Plan Note (Signed)
His [employer] dual-chamber pacemaker is working normally. We'll plan to recheck in several months. 

## 2013-04-08 NOTE — Assessment & Plan Note (Signed)
He is in rhythm approximately 80% of the time, and out of rhythm approximately 20% of the time. He'll continue his anticoagulation as well as amiodarone. We considered stopping anticoagulation with his episodes of epistaxis. However his stroke risk would be increased and for this reason we will hold off on changing his medications.

## 2013-04-08 NOTE — Progress Notes (Signed)
HPI Jacob Skinner returns today for followup. He is a very pleasant 72 year old man with a history of paroxysmal atrial fibrillation, symptomatic bradycardia, status post permanent pacemaker insertion, hypertension, and dyslipidemia. In the interim, he has been stable. He went to the emergency room several weeks ago with epistaxis. He denies chest pain or shortness of breath. No peripheral edema and no palpitations to speak of. He has had no syncope.  Allergies  Allergen Reactions  . Pheniramine-Pe-Apap      Current Outpatient Prescriptions  Medication Sig Dispense Refill  . amiodarone (PACERONE) 200 MG tablet TAKE 1 TABLET BY MOUTH EVERY DAY  90 tablet  0  . amLODipine-benazepril (LOTREL) 10-20 MG per capsule TAKE 1 CAPSULE BY MOUTH EVERY DAY  90 capsule  0  . CRESTOR 20 MG tablet TAKE 1 TABLET BY MOUTH DAILY  90 tablet  0  . hydrochlorothiazide (HYDRODIURIL) 25 MG tablet TAKE 1 TABLET BY MOUTH EVERY DAY  90 tablet  0  . metoprolol (TOPROL-XL) 200 MG 24 hr tablet TAKE 1 TABLET BY MOUTH EVERY DAY  90 tablet  0  . NEXIUM 40 MG capsule TAKE 1 CAPSULE BY MOUTH DAILY  90 capsule  0  . omega-3 acid ethyl esters (LOVAZA) 1 G capsule 4 tabs po qd      . warfarin (COUMADIN) 5 MG tablet TAKE AS DIRECTED  90 tablet  0   No current facility-administered medications for this visit.     Past Medical History  Diagnosis Date  . Nephrolithiasis   . Other and unspecified hyperlipidemia   . Hypertension   . Sinoatrial node dysfunction   . Atrial fibrillation     ROS:   All systems reviewed and negative except as noted in the HPI.   Past Surgical History  Procedure Laterality Date  . Insert / replace / remove pacemaker      guidant 10-06     No family history on file.   History   Social History  . Marital Status: Married    Spouse Name: N/A    Number of Children: N/A  . Years of Education: N/A   Occupational History  . retired Art gallery manager    Social History Main Topics  . Smoking  status: Never Smoker   . Smokeless tobacco: Not on file  . Alcohol Use: Yes     Comment: minimal  . Drug Use: Not on file  . Sexual Activity: Not on file   Other Topics Concern  . Not on file   Social History Narrative  . No narrative on file     There were no vitals taken for this visit.  Physical Exam:  Well appearing 72 year old man, NAD HEENT: Unremarkable Neck:  No JVD, no thyromegally Lungs:  Clear with no wheezes, rales, or rhonchi. HEART:  Regular rate rhythm, no murmurs, no rubs, no clicks Abd:  soft, positive bowel sounds, no organomegally, no rebound, no guarding Ext:  2 plus pulses, no edema, no cyanosis, no clubbing Skin:  No rashes no nodules Neuro:  CN II through XII intact, motor grossly intact  DEVICE  Normal device function.  See PaceArt for details.   Assess/Plan:

## 2013-04-11 ENCOUNTER — Encounter: Payer: Self-pay | Admitting: Internal Medicine

## 2013-04-24 ENCOUNTER — Ambulatory Visit (INDEPENDENT_AMBULATORY_CARE_PROVIDER_SITE_OTHER): Payer: Medicare Other | Admitting: *Deleted

## 2013-04-24 DIAGNOSIS — I4891 Unspecified atrial fibrillation: Secondary | ICD-10-CM

## 2013-04-24 DIAGNOSIS — Z7901 Long term (current) use of anticoagulants: Secondary | ICD-10-CM

## 2013-04-24 LAB — POCT INR: INR: 3.4

## 2013-05-22 ENCOUNTER — Ambulatory Visit (INDEPENDENT_AMBULATORY_CARE_PROVIDER_SITE_OTHER): Payer: Medicare Other | Admitting: Pharmacist

## 2013-05-22 DIAGNOSIS — Z7901 Long term (current) use of anticoagulants: Secondary | ICD-10-CM

## 2013-05-22 DIAGNOSIS — I4891 Unspecified atrial fibrillation: Secondary | ICD-10-CM

## 2013-05-22 LAB — POCT INR: INR: 2.4

## 2013-06-19 ENCOUNTER — Ambulatory Visit (INDEPENDENT_AMBULATORY_CARE_PROVIDER_SITE_OTHER): Payer: Medicare Other | Admitting: General Practice

## 2013-06-19 DIAGNOSIS — Z7901 Long term (current) use of anticoagulants: Secondary | ICD-10-CM

## 2013-06-19 DIAGNOSIS — I4891 Unspecified atrial fibrillation: Secondary | ICD-10-CM

## 2013-06-19 LAB — POCT INR: INR: 2.4

## 2013-07-24 ENCOUNTER — Ambulatory Visit (INDEPENDENT_AMBULATORY_CARE_PROVIDER_SITE_OTHER): Payer: Medicare Other

## 2013-07-24 DIAGNOSIS — Z7901 Long term (current) use of anticoagulants: Secondary | ICD-10-CM

## 2013-07-24 DIAGNOSIS — I4891 Unspecified atrial fibrillation: Secondary | ICD-10-CM

## 2013-07-24 LAB — POCT INR: INR: 4.2

## 2013-08-14 ENCOUNTER — Ambulatory Visit (INDEPENDENT_AMBULATORY_CARE_PROVIDER_SITE_OTHER): Payer: Medicare Other | Admitting: Pharmacist

## 2013-08-14 DIAGNOSIS — I4891 Unspecified atrial fibrillation: Secondary | ICD-10-CM

## 2013-08-14 DIAGNOSIS — Z7901 Long term (current) use of anticoagulants: Secondary | ICD-10-CM

## 2013-08-14 LAB — POCT INR: INR: 2.1

## 2013-09-15 ENCOUNTER — Ambulatory Visit (INDEPENDENT_AMBULATORY_CARE_PROVIDER_SITE_OTHER): Payer: Medicare Other | Admitting: *Deleted

## 2013-09-15 ENCOUNTER — Encounter: Payer: Self-pay | Admitting: Internal Medicine

## 2013-09-15 DIAGNOSIS — I495 Sick sinus syndrome: Secondary | ICD-10-CM

## 2013-09-15 DIAGNOSIS — I4891 Unspecified atrial fibrillation: Secondary | ICD-10-CM

## 2013-09-15 DIAGNOSIS — Z7901 Long term (current) use of anticoagulants: Secondary | ICD-10-CM

## 2013-09-15 DIAGNOSIS — Z950 Presence of cardiac pacemaker: Secondary | ICD-10-CM

## 2013-09-15 LAB — MDC_IDC_ENUM_SESS_TYPE_INCLINIC
Brady Statistic RV Percent Paced: 30 %
Date Time Interrogation Session: 20150309040000
Implantable Pulse Generator Model: 1291
Implantable Pulse Generator Serial Number: 129438
Lead Channel Impedance Value: 760 Ohm
Lead Channel Impedance Value: 780 Ohm
Lead Channel Pacing Threshold Pulse Width: 0.5 ms
Lead Channel Sensing Intrinsic Amplitude: 0.75 mV
Lead Channel Setting Pacing Amplitude: 2 V
Lead Channel Setting Pacing Amplitude: 3 V
MDC IDC MSMT LEADCHNL RA PACING THRESHOLD AMPLITUDE: 0.7 V
MDC IDC MSMT LEADCHNL RV PACING THRESHOLD AMPLITUDE: 1.2 V
MDC IDC MSMT LEADCHNL RV PACING THRESHOLD PULSEWIDTH: 0.8 ms
MDC IDC MSMT LEADCHNL RV SENSING INTR AMPL: 9.5 mV
MDC IDC SET LEADCHNL RV PACING PULSEWIDTH: 0.8 ms
MDC IDC SET LEADCHNL RV SENSING SENSITIVITY: 2.5 mV
MDC IDC STAT BRADY RA PERCENT PACED: 71 %

## 2013-09-15 LAB — POCT INR: INR: 2.1

## 2013-09-15 NOTE — Progress Notes (Signed)
Pacemaker check in clinic. Normal device function. Thresholds, sensing, impedances consistent with previous measurements. Device programmed to maximize longevity. 25% mode switch + coumadin. No high ventricular rates noted. Device programmed at appropriate safety margins. Histogram distribution appropriate for patient activity level. Device programmed to optimize intrinsic conduction. Estimated longevity 3.526yrs.   ROV w/ Dr. Ladona Ridgelaylor in 60mo.

## 2013-09-29 ENCOUNTER — Ambulatory Visit (INDEPENDENT_AMBULATORY_CARE_PROVIDER_SITE_OTHER): Payer: Medicare Other | Admitting: Pharmacist

## 2013-09-29 DIAGNOSIS — Z7901 Long term (current) use of anticoagulants: Secondary | ICD-10-CM

## 2013-09-29 DIAGNOSIS — I4891 Unspecified atrial fibrillation: Secondary | ICD-10-CM

## 2013-09-29 LAB — POCT INR: INR: 2.8

## 2013-10-28 ENCOUNTER — Ambulatory Visit (INDEPENDENT_AMBULATORY_CARE_PROVIDER_SITE_OTHER): Payer: Medicare Other | Admitting: *Deleted

## 2013-10-28 DIAGNOSIS — Z7901 Long term (current) use of anticoagulants: Secondary | ICD-10-CM

## 2013-10-28 DIAGNOSIS — I4891 Unspecified atrial fibrillation: Secondary | ICD-10-CM

## 2013-10-28 LAB — POCT INR: INR: 3.6

## 2013-12-15 ENCOUNTER — Ambulatory Visit (INDEPENDENT_AMBULATORY_CARE_PROVIDER_SITE_OTHER): Payer: Medicare Other | Admitting: *Deleted

## 2013-12-15 DIAGNOSIS — Z7901 Long term (current) use of anticoagulants: Secondary | ICD-10-CM

## 2013-12-15 DIAGNOSIS — I4891 Unspecified atrial fibrillation: Secondary | ICD-10-CM

## 2013-12-15 LAB — POCT INR: INR: 2.4

## 2014-01-13 ENCOUNTER — Ambulatory Visit (INDEPENDENT_AMBULATORY_CARE_PROVIDER_SITE_OTHER): Payer: Medicare Other | Admitting: *Deleted

## 2014-01-13 DIAGNOSIS — I4891 Unspecified atrial fibrillation: Secondary | ICD-10-CM

## 2014-01-13 DIAGNOSIS — Z7901 Long term (current) use of anticoagulants: Secondary | ICD-10-CM

## 2014-01-13 LAB — POCT INR: INR: 2.3

## 2014-02-10 ENCOUNTER — Ambulatory Visit (INDEPENDENT_AMBULATORY_CARE_PROVIDER_SITE_OTHER): Payer: Medicare Other | Admitting: *Deleted

## 2014-02-10 DIAGNOSIS — Z7901 Long term (current) use of anticoagulants: Secondary | ICD-10-CM

## 2014-02-10 DIAGNOSIS — I4891 Unspecified atrial fibrillation: Secondary | ICD-10-CM

## 2014-02-10 LAB — POCT INR: INR: 1.8

## 2014-02-24 ENCOUNTER — Ambulatory Visit (INDEPENDENT_AMBULATORY_CARE_PROVIDER_SITE_OTHER): Payer: Medicare Other | Admitting: Pharmacist

## 2014-02-24 DIAGNOSIS — Z7901 Long term (current) use of anticoagulants: Secondary | ICD-10-CM

## 2014-02-24 DIAGNOSIS — I4891 Unspecified atrial fibrillation: Secondary | ICD-10-CM

## 2014-02-24 LAB — POCT INR: INR: 2.7

## 2014-03-31 ENCOUNTER — Encounter: Payer: Self-pay | Admitting: Internal Medicine

## 2014-03-31 ENCOUNTER — Ambulatory Visit (INDEPENDENT_AMBULATORY_CARE_PROVIDER_SITE_OTHER): Payer: Medicare Other | Admitting: Internal Medicine

## 2014-03-31 ENCOUNTER — Ambulatory Visit (INDEPENDENT_AMBULATORY_CARE_PROVIDER_SITE_OTHER): Payer: Medicare Other | Admitting: *Deleted

## 2014-03-31 VITALS — BP 136/80 | HR 63 | Ht 70.0 in | Wt 248.4 lb

## 2014-03-31 DIAGNOSIS — Z950 Presence of cardiac pacemaker: Secondary | ICD-10-CM

## 2014-03-31 DIAGNOSIS — I4891 Unspecified atrial fibrillation: Secondary | ICD-10-CM

## 2014-03-31 DIAGNOSIS — I10 Essential (primary) hypertension: Secondary | ICD-10-CM

## 2014-03-31 DIAGNOSIS — Z7901 Long term (current) use of anticoagulants: Secondary | ICD-10-CM

## 2014-03-31 DIAGNOSIS — I480 Paroxysmal atrial fibrillation: Secondary | ICD-10-CM

## 2014-03-31 DIAGNOSIS — I495 Sick sinus syndrome: Secondary | ICD-10-CM

## 2014-03-31 DIAGNOSIS — R040 Epistaxis: Secondary | ICD-10-CM

## 2014-03-31 LAB — POCT INR: INR: 2.6

## 2014-03-31 LAB — MDC_IDC_ENUM_SESS_TYPE_INCLINIC
Brady Statistic AP VS Percent: 61 %
Brady Statistic AS VP Percent: 25 %
Implantable Pulse Generator Model: 1291
Implantable Pulse Generator Serial Number: 129438
Lead Channel Impedance Value: 740 Ohm
Lead Channel Impedance Value: 790 Ohm
Lead Channel Pacing Threshold Amplitude: 1 V
Lead Channel Pacing Threshold Amplitude: 1.6 V
Lead Channel Pacing Threshold Pulse Width: 0.5 ms
Lead Channel Sensing Intrinsic Amplitude: 0.75 mV
Lead Channel Sensing Intrinsic Amplitude: 8.7 mV
Lead Channel Setting Pacing Amplitude: 2 V
Lead Channel Setting Pacing Amplitude: 3 V
Lead Channel Setting Pacing Pulse Width: 0.8 ms
Lead Channel Setting Sensing Sensitivity: 2.5 mV
MDC IDC MSMT LEADCHNL RV PACING THRESHOLD PULSEWIDTH: 0.8 ms
MDC IDC STAT BRADY AP VP PERCENT: 7 %
MDC IDC STAT BRADY AS VS PERCENT: 6 %

## 2014-03-31 NOTE — Progress Notes (Signed)
HPI Jacob Skinner returns today for followup. He is a very pleasant 73 year old man with a history of paroxysmal atrial fibrillation, symptomatic bradycardia, status post permanent pacemaker insertion, hypertension, and dyslipidemia. In the interim, he has been stable. He has had no recurrent nosebleeds. He denies chest pain or shortness of breath. No peripheral edema and no palpitations to speak of. He has had no syncope.  Allergies  Allergen Reactions  . Pheniramine-Pe-Apap Rash  . Phenylephrine-Pheniramine-Dm Rash     Current Outpatient Prescriptions  Medication Sig Dispense Refill  . amiodarone (PACERONE) 200 MG tablet TAKE 1 TABLET BY MOUTH EVERY DAY  90 tablet  0  . amLODipine-benazepril (LOTREL) 10-20 MG per capsule TAKE 1 CAPSULE BY MOUTH EVERY DAY  90 capsule  0  . CRESTOR 20 MG tablet TAKE 1 TABLET BY MOUTH DAILY  90 tablet  0  . hydrochlorothiazide (HYDRODIURIL) 25 MG tablet TAKE 1 TABLET BY MOUTH EVERY DAY  90 tablet  0  . metoprolol (TOPROL-XL) 200 MG 24 hr tablet TAKE 1 TABLET BY MOUTH EVERY DAY  90 tablet  0  . NEXIUM 40 MG capsule TAKE 1 CAPSULE BY MOUTH DAILY  90 capsule  0  . omega-3 acid ethyl esters (LOVAZA) 1 G capsule Take 4 tablets by mouth daily      . warfarin (COUMADIN) 5 MG tablet TAKE AS DIRECTED  90 tablet  0   No current facility-administered medications for this visit.     Past Medical History  Diagnosis Date  . Nephrolithiasis   . Other and unspecified hyperlipidemia   . Hypertension   . Sinoatrial node dysfunction   . Atrial fibrillation     ROS:   All systems reviewed and negative except as noted in the HPI.   Past Surgical History  Procedure Laterality Date  . Insert / replace / remove pacemaker      guidant 10-06     No family history on file.   History   Social History  . Marital Status: Married    Spouse Name: N/A    Number of Children: N/A  . Years of Education: N/A   Occupational History  . retired Art gallery manager    Social  History Main Topics  . Smoking status: Never Smoker   . Smokeless tobacco: Not on file  . Alcohol Use: Yes     Comment: minimal  . Drug Use: Not on file  . Sexual Activity: Not on file   Other Topics Concern  . Not on file   Social History Narrative  . No narrative on file     BP 136/80  Pulse 63  Ht  (1.778 m)  Wt 248 lb 6.4 oz (112.674 kg)  BMI 35.64 kg/m2  Physical Exam:  Well appearing 73 year old man, NAD HEENT: Unremarkable Neck:  No JVD, no thyromegally Lungs:  Clear with no wheezes, rales, or rhonchi. HEART:  Regular rate rhythm, no murmurs, no rubs, no clicks Abd:  soft, positive bowel sounds, no organomegally, no rebound, no guarding Ext:  2 plus pulses, no edema, no cyanosis, no clubbing Skin:  No rashes no nodules Neuro:  CN II through XII intact, motor grossly intact  DEVICE  Normal device function.  See PaceArt for details.   Assess/Plan:

## 2014-03-31 NOTE — Assessment & Plan Note (Signed)
His nosebleeds have resolved. We'll follow.

## 2014-03-31 NOTE — Assessment & Plan Note (Signed)
Interrogation of his pacemaker demonstrates no atrial fibrillation. He'll continue his current medications including amiodarone.

## 2014-03-31 NOTE — Assessment & Plan Note (Signed)
His [employer] dual-chamber pacemaker is working normally. We'll plan to recheck in several months. 

## 2014-03-31 NOTE — Assessment & Plan Note (Signed)
His blood pressure has been well controlled. He will continue his current medical therapy. 

## 2014-03-31 NOTE — Patient Instructions (Addendum)
Your physician wants you to follow-up in: 6 months in the device clinic and 12 months with Dr Taylor You will receive a reminder letter in the mail two months in advance. If you don't receive a letter, please call our office to schedule the follow-up appointment.  

## 2014-04-20 ENCOUNTER — Telehealth: Payer: Self-pay | Admitting: Internal Medicine

## 2014-04-20 NOTE — Telephone Encounter (Signed)
New message    Office calling  Patient need to hold coumadin 5 days prior or bridge with Lovenox.    Procedure on 11/11.

## 2014-05-05 ENCOUNTER — Ambulatory Visit (INDEPENDENT_AMBULATORY_CARE_PROVIDER_SITE_OTHER): Payer: Medicare Other

## 2014-05-05 DIAGNOSIS — I4891 Unspecified atrial fibrillation: Secondary | ICD-10-CM

## 2014-05-05 DIAGNOSIS — Z7901 Long term (current) use of anticoagulants: Secondary | ICD-10-CM

## 2014-05-05 LAB — POCT INR: INR: 2

## 2014-06-16 ENCOUNTER — Ambulatory Visit (INDEPENDENT_AMBULATORY_CARE_PROVIDER_SITE_OTHER): Payer: Medicare Other

## 2014-06-16 DIAGNOSIS — Z7901 Long term (current) use of anticoagulants: Secondary | ICD-10-CM

## 2014-06-16 DIAGNOSIS — I4891 Unspecified atrial fibrillation: Secondary | ICD-10-CM

## 2014-06-16 LAB — POCT INR: INR: 2.2

## 2014-06-18 ENCOUNTER — Telehealth: Payer: Self-pay | Admitting: *Deleted

## 2014-06-18 NOTE — Telephone Encounter (Signed)
Will make patient aware on next follow up on 07/14/14 secondary to language barrier.

## 2014-06-18 NOTE — Telephone Encounter (Signed)
-----   Message from Marinus MawGregg W Taylor, MD sent at 06/17/2014  8:41 AM EST -----  Patient may hold coumadin for 5 days prior to procedure. Low risk. GT ----- Message -----    From: Olin Hauserandance Bianca Artez Regis, RN    Sent: 06/16/2014  10:27 AM      To: Marinus MawGregg W Taylor, MD  Patient [place] in [place] on [place]. Please advise if ok to hold coumadin 5 days prior to procedure.

## 2014-07-16 ENCOUNTER — Ambulatory Visit (INDEPENDENT_AMBULATORY_CARE_PROVIDER_SITE_OTHER): Payer: Medicare Other | Admitting: *Deleted

## 2014-07-16 DIAGNOSIS — Z7901 Long term (current) use of anticoagulants: Secondary | ICD-10-CM

## 2014-07-16 DIAGNOSIS — I4891 Unspecified atrial fibrillation: Secondary | ICD-10-CM

## 2014-07-16 LAB — POCT INR: INR: 2

## 2014-07-30 ENCOUNTER — Ambulatory Visit (INDEPENDENT_AMBULATORY_CARE_PROVIDER_SITE_OTHER): Payer: Medicare Other

## 2014-07-30 DIAGNOSIS — Z7901 Long term (current) use of anticoagulants: Secondary | ICD-10-CM

## 2014-07-30 DIAGNOSIS — I4891 Unspecified atrial fibrillation: Secondary | ICD-10-CM

## 2014-07-30 LAB — POCT INR: INR: 2

## 2014-08-27 ENCOUNTER — Ambulatory Visit (INDEPENDENT_AMBULATORY_CARE_PROVIDER_SITE_OTHER): Payer: Medicare Other | Admitting: *Deleted

## 2014-08-27 DIAGNOSIS — Z7901 Long term (current) use of anticoagulants: Secondary | ICD-10-CM

## 2014-08-27 DIAGNOSIS — I4891 Unspecified atrial fibrillation: Secondary | ICD-10-CM

## 2014-08-27 LAB — POCT INR: INR: 1.7

## 2014-09-18 ENCOUNTER — Ambulatory Visit (INDEPENDENT_AMBULATORY_CARE_PROVIDER_SITE_OTHER): Payer: Medicare Other | Admitting: *Deleted

## 2014-09-18 DIAGNOSIS — I4891 Unspecified atrial fibrillation: Secondary | ICD-10-CM

## 2014-09-18 DIAGNOSIS — Z7901 Long term (current) use of anticoagulants: Secondary | ICD-10-CM | POA: Diagnosis not present

## 2014-09-18 LAB — POCT INR: INR: 3.1

## 2014-10-09 ENCOUNTER — Ambulatory Visit (INDEPENDENT_AMBULATORY_CARE_PROVIDER_SITE_OTHER): Payer: Medicare Other

## 2014-10-09 DIAGNOSIS — Z7901 Long term (current) use of anticoagulants: Secondary | ICD-10-CM | POA: Diagnosis not present

## 2014-10-09 DIAGNOSIS — I4891 Unspecified atrial fibrillation: Secondary | ICD-10-CM

## 2014-10-09 LAB — POCT INR: INR: 2.9

## 2014-10-13 ENCOUNTER — Encounter: Payer: Self-pay | Admitting: *Deleted

## 2014-10-21 ENCOUNTER — Ambulatory Visit (INDEPENDENT_AMBULATORY_CARE_PROVIDER_SITE_OTHER): Payer: Medicare Other | Admitting: *Deleted

## 2014-10-21 DIAGNOSIS — I495 Sick sinus syndrome: Secondary | ICD-10-CM | POA: Diagnosis not present

## 2014-10-21 DIAGNOSIS — I480 Paroxysmal atrial fibrillation: Secondary | ICD-10-CM | POA: Diagnosis not present

## 2014-10-21 LAB — MDC_IDC_ENUM_SESS_TYPE_INCLINIC
Brady Statistic RA Percent Paced: 24 %
Brady Statistic RV Percent Paced: 44 %
Date Time Interrogation Session: 20160413040000
Lead Channel Pacing Threshold Amplitude: 1.1 V
Lead Channel Pacing Threshold Pulse Width: 0.5 ms
Lead Channel Sensing Intrinsic Amplitude: 0.75 mV
Lead Channel Sensing Intrinsic Amplitude: 10 mV
Lead Channel Setting Pacing Amplitude: 2 V
Lead Channel Setting Pacing Amplitude: 3 V
MDC IDC MSMT LEADCHNL RA IMPEDANCE VALUE: 790 Ohm
MDC IDC MSMT LEADCHNL RV IMPEDANCE VALUE: 740 Ohm
MDC IDC MSMT LEADCHNL RV PACING THRESHOLD AMPLITUDE: 1.3 V
MDC IDC MSMT LEADCHNL RV PACING THRESHOLD PULSEWIDTH: 0.8 ms
MDC IDC PG SERIAL: 129438
MDC IDC SET LEADCHNL RV PACING PULSEWIDTH: 0.8 ms
MDC IDC SET LEADCHNL RV SENSING SENSITIVITY: 2.5 mV

## 2014-10-21 NOTE — Progress Notes (Signed)
Normal pacer check AF total time 44% + Warfarin Underlying rhythm SB 30 ROV 6 mths w/GT.

## 2014-11-04 ENCOUNTER — Encounter: Payer: Self-pay | Admitting: Internal Medicine

## 2014-11-09 ENCOUNTER — Ambulatory Visit (INDEPENDENT_AMBULATORY_CARE_PROVIDER_SITE_OTHER): Payer: Medicare Other

## 2014-11-09 DIAGNOSIS — I4891 Unspecified atrial fibrillation: Secondary | ICD-10-CM | POA: Diagnosis not present

## 2014-11-09 DIAGNOSIS — Z7901 Long term (current) use of anticoagulants: Secondary | ICD-10-CM | POA: Diagnosis not present

## 2014-11-09 LAB — POCT INR: INR: 2.1

## 2014-12-14 ENCOUNTER — Ambulatory Visit (INDEPENDENT_AMBULATORY_CARE_PROVIDER_SITE_OTHER): Payer: Medicare Other

## 2014-12-14 DIAGNOSIS — I4891 Unspecified atrial fibrillation: Secondary | ICD-10-CM | POA: Diagnosis not present

## 2014-12-14 DIAGNOSIS — Z7901 Long term (current) use of anticoagulants: Secondary | ICD-10-CM

## 2014-12-14 LAB — POCT INR: INR: 3

## 2015-01-18 ENCOUNTER — Ambulatory Visit (INDEPENDENT_AMBULATORY_CARE_PROVIDER_SITE_OTHER): Payer: Medicare Other

## 2015-01-18 DIAGNOSIS — Z7901 Long term (current) use of anticoagulants: Secondary | ICD-10-CM

## 2015-01-18 DIAGNOSIS — I4891 Unspecified atrial fibrillation: Secondary | ICD-10-CM | POA: Diagnosis not present

## 2015-01-18 LAB — POCT INR: INR: 2.6

## 2015-02-26 ENCOUNTER — Ambulatory Visit (INDEPENDENT_AMBULATORY_CARE_PROVIDER_SITE_OTHER): Payer: Medicare Other

## 2015-02-26 DIAGNOSIS — I4891 Unspecified atrial fibrillation: Secondary | ICD-10-CM | POA: Diagnosis not present

## 2015-02-26 DIAGNOSIS — Z7901 Long term (current) use of anticoagulants: Secondary | ICD-10-CM

## 2015-02-26 LAB — POCT INR: INR: 2.9

## 2015-04-05 ENCOUNTER — Other Ambulatory Visit: Payer: Self-pay

## 2015-04-06 ENCOUNTER — Ambulatory Visit (INDEPENDENT_AMBULATORY_CARE_PROVIDER_SITE_OTHER): Payer: Medicare Other

## 2015-04-06 ENCOUNTER — Encounter: Payer: Self-pay | Admitting: Internal Medicine

## 2015-04-06 ENCOUNTER — Ambulatory Visit (INDEPENDENT_AMBULATORY_CARE_PROVIDER_SITE_OTHER): Payer: Medicare Other | Admitting: Internal Medicine

## 2015-04-06 VITALS — BP 120/74 | HR 60 | Ht 70.0 in | Wt 235.4 lb

## 2015-04-06 DIAGNOSIS — I495 Sick sinus syndrome: Secondary | ICD-10-CM

## 2015-04-06 DIAGNOSIS — I480 Paroxysmal atrial fibrillation: Secondary | ICD-10-CM

## 2015-04-06 DIAGNOSIS — Z7901 Long term (current) use of anticoagulants: Secondary | ICD-10-CM | POA: Diagnosis not present

## 2015-04-06 DIAGNOSIS — Z950 Presence of cardiac pacemaker: Secondary | ICD-10-CM | POA: Diagnosis not present

## 2015-04-06 DIAGNOSIS — I4891 Unspecified atrial fibrillation: Secondary | ICD-10-CM | POA: Diagnosis not present

## 2015-04-06 DIAGNOSIS — IMO0001 Reserved for inherently not codable concepts without codable children: Secondary | ICD-10-CM

## 2015-04-06 DIAGNOSIS — R61 Generalized hyperhidrosis: Secondary | ICD-10-CM

## 2015-04-06 LAB — T4, FREE: FREE T4: 1.02 ng/dL (ref 0.60–1.60)

## 2015-04-06 LAB — CUP PACEART INCLINIC DEVICE CHECK
Brady Statistic RV Percent Paced: 23 %
Lead Channel Impedance Value: 740 Ohm
Lead Channel Impedance Value: 790 Ohm
Lead Channel Pacing Threshold Amplitude: 1.3 V
Lead Channel Pacing Threshold Pulse Width: 0.5 ms
Lead Channel Sensing Intrinsic Amplitude: 0.75 mV
Lead Channel Sensing Intrinsic Amplitude: 9.7 mV
Lead Channel Setting Pacing Amplitude: 2.6 V
MDC IDC MSMT LEADCHNL RV PACING THRESHOLD AMPLITUDE: 1.6 V
MDC IDC MSMT LEADCHNL RV PACING THRESHOLD PULSEWIDTH: 0.8 ms
MDC IDC SESS DTM: 20160927040000
MDC IDC SET LEADCHNL RV PACING AMPLITUDE: 3.2 V
MDC IDC SET LEADCHNL RV PACING PULSEWIDTH: 0.8 ms
MDC IDC SET LEADCHNL RV SENSING SENSITIVITY: 2.5 mV
MDC IDC STAT BRADY RA PERCENT PACED: 58 %
Pulse Gen Serial Number: 129438

## 2015-04-06 LAB — POCT INR: INR: 2.7

## 2015-04-06 LAB — TSH: TSH: 2.53 u[IU]/mL (ref 0.35–4.50)

## 2015-04-06 NOTE — Assessment & Plan Note (Signed)
He is maintaining NSR. He is on amiodarone.

## 2015-04-06 NOTE — Assessment & Plan Note (Signed)
He is asymptomatic. Will follow.  

## 2015-04-06 NOTE — Assessment & Plan Note (Signed)
Etiology is unclear. He is on amio and could have some thyroid problems. Will check TSH and T4.

## 2015-04-06 NOTE — Patient Instructions (Addendum)
Medication Instructions:  Your physician recommends that you continue on your current medications as directed. Please refer to the Current Medication list given to you today.   Labwork: Your physician recommends that you return for lab work today: TSH/T4   Testing/Procedures: None ordered  Follow-Up: Your physician wants you to follow-up in: 6 months in the device clinic and 12 months with Dr Court Joy will receive a reminder letter in the mail two months in advance. If you don't receive a letter, please call our office to schedule the follow-up appointment.   Any Other Special Instructions Will Be Listed Below (If Applicable).

## 2015-04-06 NOTE — Progress Notes (Signed)
HPI [name] returns today for followup. He is a very pleasant 74 year old man with a history of paroxysmal atrial fibrillation, symptomatic bradycardia, status post permanent pacemaker insertion, hypertension, and dyslipidemia. In the interim, he has been stable. He has had no recurrent nosebleeds. He denies chest pain or shortness of breath. No peripheral edema and no palpitations to speak of. He has had no syncope. His only complaint today is that when he eats, he sweats after the meal. Also when he walks he has some sweating. No change in exercise tolerance however. Allergies  Allergen Reactions  . Pheniramine-Pe-Apap Rash  . Phenylephrine-Pheniramine-Dm Rash     Current Outpatient Prescriptions  Medication Sig Dispense Refill  . amiodarone (PACERONE) 200 MG tablet TAKE 1 TABLET BY MOUTH EVERY DAY 90 tablet 0  . amLODipine-benazepril (LOTREL) 5-40 MG per capsule Take 1 capsule by mouth daily.    . CRESTOR 20 MG tablet TAKE 1 TABLET BY MOUTH DAILY 90 tablet 0  . hydrochlorothiazide (HYDRODIURIL) 25 MG tablet TAKE 1 TABLET BY MOUTH EVERY DAY 90 tablet 0  . metoprolol (TOPROL-XL) 200 MG 24 hr tablet TAKE 1 TABLET BY MOUTH EVERY DAY 90 tablet 0  . NEXIUM 40 MG capsule TAKE 1 CAPSULE BY MOUTH DAILY 90 capsule 0  . omega-3 acid ethyl esters (LOVAZA) 1 G capsule Take 4 tablets by mouth daily    . warfarin (COUMADIN) 5 MG tablet TAKE AS DIRECTED 90 tablet 0   No current facility-administered medications for this visit.     Past Medical History  Diagnosis Date  . Nephrolithiasis   . Other and unspecified hyperlipidemia   . Hypertension   . Sinoatrial node dysfunction   . Atrial fibrillation     ROS:   All systems reviewed and negative except as noted in the HPI.   Past Surgical History  Procedure Laterality Date  . Insert / replace / remove pacemaker      guidant 10-06     Family History  Problem Relation Age of Onset  . Heart attack Mother   . Hypertension Father       Social History   Social History  . Marital Status: Married    Spouse Name: N/A  . Number of Children: N/A  . Years of Education: N/A   Occupational History  . retired Art gallery manager    Social History Main Topics  . Smoking status: Never Smoker   . Smokeless tobacco: Not on file  . Alcohol Use: Yes     Comment: minimal  . Drug Use: Not on file  . Sexual Activity: Not on file   Other Topics Concern  . Not on file   Social History Narrative     BP 120/74 mmHg  Pulse 60  Ht  (1.778 m)  Wt 235 lb 6.4 oz (106.777 kg)  BMI 33.78 kg/m2  Physical Exam:  Well appearing 74 year old man, NAD HEENT: Unremarkable Neck:  6 cm JVD, no thyromegally Lungs:  Clear with no wheezes, rales, or rhonchi. HEART:  Regular rate rhythm, no murmurs, no rubs, no clicks Abd:  soft, positive bowel sounds, no organomegally, no rebound, no guarding Ext:  2 plus pulses, no edema, no cyanosis, no clubbing Skin:  No rashes no nodules Neuro:  CN II through XII intact, motor grossly intact  DEVICE  Normal device function.  See PaceArt for details.   Assess/Plan:

## 2015-04-06 NOTE — Assessment & Plan Note (Signed)
His [employer] device is working normally. Will follow.

## 2015-04-19 ENCOUNTER — Encounter: Payer: Self-pay | Admitting: Internal Medicine

## 2015-05-18 ENCOUNTER — Ambulatory Visit (INDEPENDENT_AMBULATORY_CARE_PROVIDER_SITE_OTHER): Payer: Medicare Other | Admitting: *Deleted

## 2015-05-18 DIAGNOSIS — Z7901 Long term (current) use of anticoagulants: Secondary | ICD-10-CM | POA: Diagnosis not present

## 2015-05-18 DIAGNOSIS — I4891 Unspecified atrial fibrillation: Secondary | ICD-10-CM

## 2015-05-18 LAB — POCT INR: INR: 2.2

## 2015-07-07 ENCOUNTER — Ambulatory Visit (INDEPENDENT_AMBULATORY_CARE_PROVIDER_SITE_OTHER): Payer: Medicare Other | Admitting: *Deleted

## 2015-07-07 DIAGNOSIS — Z7901 Long term (current) use of anticoagulants: Secondary | ICD-10-CM | POA: Diagnosis not present

## 2015-07-07 DIAGNOSIS — I4891 Unspecified atrial fibrillation: Secondary | ICD-10-CM

## 2015-07-07 LAB — POCT INR: INR: 2.5

## 2015-08-16 ENCOUNTER — Ambulatory Visit (INDEPENDENT_AMBULATORY_CARE_PROVIDER_SITE_OTHER): Payer: Medicare Other

## 2015-08-16 DIAGNOSIS — Z7901 Long term (current) use of anticoagulants: Secondary | ICD-10-CM | POA: Diagnosis not present

## 2015-08-16 DIAGNOSIS — I4891 Unspecified atrial fibrillation: Secondary | ICD-10-CM

## 2015-08-16 LAB — POCT INR: INR: 3.4

## 2015-09-13 ENCOUNTER — Ambulatory Visit (INDEPENDENT_AMBULATORY_CARE_PROVIDER_SITE_OTHER): Payer: Medicare Other | Admitting: Pharmacist

## 2015-09-13 DIAGNOSIS — Z7901 Long term (current) use of anticoagulants: Secondary | ICD-10-CM | POA: Diagnosis not present

## 2015-09-13 DIAGNOSIS — I4891 Unspecified atrial fibrillation: Secondary | ICD-10-CM | POA: Diagnosis not present

## 2015-09-13 LAB — POCT INR: INR: 2.3

## 2015-10-04 ENCOUNTER — Encounter: Payer: Self-pay | Admitting: Internal Medicine

## 2015-10-04 ENCOUNTER — Ambulatory Visit (INDEPENDENT_AMBULATORY_CARE_PROVIDER_SITE_OTHER): Payer: Medicare Other | Admitting: *Deleted

## 2015-10-04 ENCOUNTER — Ambulatory Visit (INDEPENDENT_AMBULATORY_CARE_PROVIDER_SITE_OTHER): Payer: Medicare Other | Admitting: Pharmacist

## 2015-10-04 DIAGNOSIS — I495 Sick sinus syndrome: Secondary | ICD-10-CM | POA: Diagnosis not present

## 2015-10-04 DIAGNOSIS — Z7901 Long term (current) use of anticoagulants: Secondary | ICD-10-CM

## 2015-10-04 DIAGNOSIS — Z950 Presence of cardiac pacemaker: Secondary | ICD-10-CM | POA: Diagnosis not present

## 2015-10-04 DIAGNOSIS — I4891 Unspecified atrial fibrillation: Secondary | ICD-10-CM | POA: Diagnosis not present

## 2015-10-04 LAB — POCT INR: INR: 2.1

## 2015-10-04 LAB — CUP PACEART INCLINIC DEVICE CHECK
Brady Statistic AP VS Percent: 76 %
Brady Statistic AS VP Percent: 13 %
Brady Statistic AS VS Percent: 3 %
Date Time Interrogation Session: 20170327040000
Implantable Lead Implant Date: 20061026
Implantable Lead Location: 753860
Implantable Lead Model: 4053
Implantable Lead Model: 4054
Implantable Lead Serial Number: 416839
Lead Channel Impedance Value: 720 Ohm
Lead Channel Impedance Value: 790 Ohm
Lead Channel Pacing Threshold Amplitude: 1.3 V
Lead Channel Pacing Threshold Pulse Width: 0.5 ms
Lead Channel Sensing Intrinsic Amplitude: 0.5 mV
Lead Channel Sensing Intrinsic Amplitude: 9.8 mV
Lead Channel Setting Sensing Sensitivity: 2.5 mV
MDC IDC LEAD IMPLANT DT: 20061026
MDC IDC LEAD LOCATION: 753859
MDC IDC LEAD SERIAL: 453015
MDC IDC MSMT LEADCHNL RV PACING THRESHOLD AMPLITUDE: 1.6 V
MDC IDC MSMT LEADCHNL RV PACING THRESHOLD PULSEWIDTH: 0.8 ms
MDC IDC SET LEADCHNL RA PACING AMPLITUDE: 2.6 V
MDC IDC SET LEADCHNL RV PACING AMPLITUDE: 3.2 V
MDC IDC SET LEADCHNL RV PACING PULSEWIDTH: 0.8 ms
MDC IDC STAT BRADY AP VP PERCENT: 9 %
Pulse Gen Serial Number: 129438

## 2015-10-04 NOTE — Progress Notes (Signed)
Pacemaker check in clinic. Normal device function. Thresholds, sensing, impedances consistent with previous measurements. Device programmed to maximize longevity. 550 mode switches (13%), +warfarin. No high ventricular rates noted. Device programmed at appropriate safety margins. Histogram distribution appropriate for patient activity level. Device programmed to optimize intrinsic conduction. Estimated longevity 1.5 years. Patient education completed via interpreter. ROV with GT in 6 months.

## 2015-11-09 ENCOUNTER — Ambulatory Visit (INDEPENDENT_AMBULATORY_CARE_PROVIDER_SITE_OTHER): Payer: Medicare Other | Admitting: Pharmacist

## 2015-11-09 DIAGNOSIS — Z7901 Long term (current) use of anticoagulants: Secondary | ICD-10-CM

## 2015-11-09 DIAGNOSIS — I4891 Unspecified atrial fibrillation: Secondary | ICD-10-CM | POA: Diagnosis not present

## 2015-11-09 LAB — POCT INR: INR: 3.5

## 2015-12-07 ENCOUNTER — Ambulatory Visit (INDEPENDENT_AMBULATORY_CARE_PROVIDER_SITE_OTHER): Payer: Medicare Other | Admitting: Pharmacist

## 2015-12-07 DIAGNOSIS — I4891 Unspecified atrial fibrillation: Secondary | ICD-10-CM

## 2015-12-07 DIAGNOSIS — Z7901 Long term (current) use of anticoagulants: Secondary | ICD-10-CM

## 2015-12-07 LAB — POCT INR: INR: 3.2

## 2016-01-04 ENCOUNTER — Ambulatory Visit (INDEPENDENT_AMBULATORY_CARE_PROVIDER_SITE_OTHER): Payer: Medicare Other | Admitting: Pharmacist

## 2016-01-04 DIAGNOSIS — Z7901 Long term (current) use of anticoagulants: Secondary | ICD-10-CM | POA: Diagnosis not present

## 2016-01-04 DIAGNOSIS — I4891 Unspecified atrial fibrillation: Secondary | ICD-10-CM | POA: Diagnosis not present

## 2016-01-04 LAB — POCT INR: INR: 2.1

## 2016-02-01 ENCOUNTER — Ambulatory Visit (INDEPENDENT_AMBULATORY_CARE_PROVIDER_SITE_OTHER): Payer: Medicare Other | Admitting: Pharmacist

## 2016-02-01 ENCOUNTER — Encounter (INDEPENDENT_AMBULATORY_CARE_PROVIDER_SITE_OTHER): Payer: Self-pay

## 2016-02-01 DIAGNOSIS — I4891 Unspecified atrial fibrillation: Secondary | ICD-10-CM

## 2016-02-01 DIAGNOSIS — Z7901 Long term (current) use of anticoagulants: Secondary | ICD-10-CM | POA: Diagnosis not present

## 2016-02-01 LAB — POCT INR: INR: 2.2

## 2016-02-29 ENCOUNTER — Ambulatory Visit (INDEPENDENT_AMBULATORY_CARE_PROVIDER_SITE_OTHER): Payer: Medicare Other | Admitting: *Deleted

## 2016-02-29 DIAGNOSIS — I4891 Unspecified atrial fibrillation: Secondary | ICD-10-CM | POA: Diagnosis not present

## 2016-02-29 DIAGNOSIS — Z7901 Long term (current) use of anticoagulants: Secondary | ICD-10-CM

## 2016-02-29 LAB — POCT INR: INR: 3.4

## 2016-03-21 ENCOUNTER — Ambulatory Visit (INDEPENDENT_AMBULATORY_CARE_PROVIDER_SITE_OTHER): Payer: Medicare Other | Admitting: *Deleted

## 2016-03-21 DIAGNOSIS — I4891 Unspecified atrial fibrillation: Secondary | ICD-10-CM | POA: Diagnosis not present

## 2016-03-21 DIAGNOSIS — Z7901 Long term (current) use of anticoagulants: Secondary | ICD-10-CM

## 2016-03-21 LAB — POCT INR: INR: 2.3

## 2016-04-03 ENCOUNTER — Telehealth: Payer: Self-pay | Admitting: Internal Medicine

## 2016-04-03 NOTE — Telephone Encounter (Signed)
error 

## 2016-04-04 ENCOUNTER — Encounter: Payer: Self-pay | Admitting: Internal Medicine

## 2016-04-04 ENCOUNTER — Encounter (INDEPENDENT_AMBULATORY_CARE_PROVIDER_SITE_OTHER): Payer: Self-pay

## 2016-04-04 ENCOUNTER — Ambulatory Visit (INDEPENDENT_AMBULATORY_CARE_PROVIDER_SITE_OTHER): Payer: Medicare Other | Admitting: *Deleted

## 2016-04-04 ENCOUNTER — Ambulatory Visit (INDEPENDENT_AMBULATORY_CARE_PROVIDER_SITE_OTHER): Payer: Medicare Other | Admitting: Internal Medicine

## 2016-04-04 VITALS — BP 114/80 | HR 60 | Ht 70.0 in | Wt 240.1 lb

## 2016-04-04 DIAGNOSIS — Z950 Presence of cardiac pacemaker: Secondary | ICD-10-CM

## 2016-04-04 DIAGNOSIS — Z7901 Long term (current) use of anticoagulants: Secondary | ICD-10-CM | POA: Diagnosis not present

## 2016-04-04 DIAGNOSIS — I4891 Unspecified atrial fibrillation: Secondary | ICD-10-CM

## 2016-04-04 DIAGNOSIS — I480 Paroxysmal atrial fibrillation: Secondary | ICD-10-CM | POA: Diagnosis not present

## 2016-04-04 LAB — POCT INR: INR: 2

## 2016-04-04 NOTE — Progress Notes (Signed)
HPI [name] returns today for followup. He is a very pleasant 75 year old man with a history of paroxysmal atrial fibrillation, symptomatic bradycardia, status post permanent pacemaker insertion, hypertension, and dyslipidemia. In the interim, he has been stable. He has had no recurrent nosebleeds. He denies chest pain or shortness of breath. No peripheral edema and no palpitations to speak of. He has had no syncope. His only complaint today is that when he eats, he sweats after the meal. Also when he walks he has some sweating. No change in exercise tolerance however. He is swimming 5 days a week and works in his yard frequently.  Allergies  Allergen Reactions  . Tamiflu [Oseltamivir]     Rash and itching   . Diphenhydramine Hcl Rash  . Pheniramine-Pe-Apap Rash  . Phenylephrine-Pheniramine-Dm Rash     Current Outpatient Prescriptions  Medication Sig Dispense Refill  . amiodarone (PACERONE) 200 MG tablet TAKE 1 TABLET BY MOUTH EVERY DAY 90 tablet 0  . amLODipine-benazepril (LOTREL) 5-40 MG per capsule Take 1 capsule by mouth daily.    . CRESTOR 20 MG tablet TAKE 1 TABLET BY MOUTH DAILY 90 tablet 0  . hydrochlorothiazide (HYDRODIURIL) 25 MG tablet TAKE 1 TABLET BY MOUTH EVERY DAY 90 tablet 0  . metoprolol (TOPROL-XL) 200 MG 24 hr tablet TAKE 1 TABLET BY MOUTH EVERY DAY 90 tablet 0  . NEXIUM 40 MG capsule TAKE 1 CAPSULE BY MOUTH DAILY 90 capsule 0  . omega-3 acid ethyl esters (LOVAZA) 1 G capsule Take 4 tablets by mouth daily    . warfarin (COUMADIN) 5 MG tablet TAKE AS DIRECTED 90 tablet 0   No current facility-administered medications for this visit.      Past Medical History:  Diagnosis Date  . Atrial fibrillation (HCC)   . Hypertension   . Nephrolithiasis   . Other and unspecified hyperlipidemia   . Sinoatrial node dysfunction (HCC)     ROS:   All systems reviewed and negative except as noted in the HPI.   Past Surgical History:  Procedure Laterality Date  . INSERT  / REPLACE / REMOVE PACEMAKER     guidant 10-06     Family History  Problem Relation Age of Onset  . Heart attack Mother   . Hypertension Father      Social History   Social History  . Marital status: Married    Spouse name: N/A  . Number of children: N/A  . Years of education: N/A   Occupational History  . retired Art gallery managerengineer Retired   Social History Main Topics  . Smoking status: Never Smoker  . Smokeless tobacco: Not on file  . Alcohol use Yes     Comment: minimal  . Drug use: Unknown  . Sexual activity: Not on file   Other Topics Concern  . Not on file   Social History Narrative  . No narrative on file     BP 114/80   Pulse 60   Ht 5\' 10"  (1.778 m)   Wt 240 lb 1.9 oz (108.9 kg)   BMI 34.45 kg/m   Physical Exam:  Well appearing 75 year old man, NAD HEENT: Unremarkable Neck:  6 cm JVD, no thyromegally Lungs:  Clear with no wheezes, rales, or rhonchi. HEART:  Regular rate rhythm, no murmurs, no rubs, no clicks Abd:  soft, positive bowel sounds, no organomegally, no rebound, no guarding Ext:  2 plus pulses, no edema, no cyanosis, no clubbing Skin:  No rashes no nodules Neuro:  CN II through  XII intact, motor grossly intact  DEVICE  Normal device function.  See PaceArt for details.   Assess/Plan:  1. Sinus node dysfunction - he is asymptomatic, s/p PPM 2. PPM - his Sonterra DDD PM is working normally. Will follow. 3. HTN - his blood pressure is good. He will continue his current meds. 4. PAF - he will continue his warfarin and amiodarone. He is maintaining NSR much of the time.  Leonia Reeves.

## 2016-04-04 NOTE — Patient Instructions (Addendum)
Medication Instructions:  Your physician recommends that you continue on your current medications as directed. Please refer to the Current Medication list given to you today.   Labwork: None Ordered   Testing/Procedures: None Ordered   Follow-Up: Your physician wants you to follow-up in: 6 months with Device Clinic and 1 year with Dr. Taylor.  You will receive a reminder letter in the mail two months in advance. If you don't receive a letter, please call our office to schedule the follow-up appointment.    Any Other Special Instructions Will Be Listed Below (If Applicable).     If you need a refill on your cardiac medications before your next appointment, please call your pharmacy.   

## 2016-05-08 ENCOUNTER — Ambulatory Visit (INDEPENDENT_AMBULATORY_CARE_PROVIDER_SITE_OTHER): Payer: Medicare Other | Admitting: Pharmacist

## 2016-05-08 DIAGNOSIS — Z7901 Long term (current) use of anticoagulants: Secondary | ICD-10-CM | POA: Diagnosis not present

## 2016-05-08 DIAGNOSIS — I4891 Unspecified atrial fibrillation: Secondary | ICD-10-CM | POA: Diagnosis not present

## 2016-05-08 LAB — POCT INR: INR: 3.7

## 2016-05-31 ENCOUNTER — Ambulatory Visit (INDEPENDENT_AMBULATORY_CARE_PROVIDER_SITE_OTHER): Payer: Medicare Other | Admitting: Pharmacist

## 2016-05-31 DIAGNOSIS — I4891 Unspecified atrial fibrillation: Secondary | ICD-10-CM

## 2016-05-31 DIAGNOSIS — Z7901 Long term (current) use of anticoagulants: Secondary | ICD-10-CM

## 2016-05-31 LAB — POCT INR: INR: 2.4

## 2016-06-06 ENCOUNTER — Other Ambulatory Visit: Payer: Self-pay

## 2016-06-06 MED ORDER — WARFARIN SODIUM 5 MG PO TABS: ORAL_TABLET | ORAL | 1 refills | Status: DC

## 2016-06-07 ENCOUNTER — Other Ambulatory Visit: Payer: Self-pay | Admitting: Internal Medicine

## 2016-06-07 NOTE — Telephone Encounter (Signed)
Ok to fill 

## 2016-06-28 ENCOUNTER — Ambulatory Visit (INDEPENDENT_AMBULATORY_CARE_PROVIDER_SITE_OTHER): Payer: Medicare Other | Admitting: *Deleted

## 2016-06-28 DIAGNOSIS — Z7901 Long term (current) use of anticoagulants: Secondary | ICD-10-CM

## 2016-06-28 DIAGNOSIS — I4891 Unspecified atrial fibrillation: Secondary | ICD-10-CM

## 2016-06-28 LAB — POCT INR: INR: 3.7

## 2016-07-12 ENCOUNTER — Ambulatory Visit (INDEPENDENT_AMBULATORY_CARE_PROVIDER_SITE_OTHER): Payer: Medicare Other | Admitting: *Deleted

## 2016-07-12 DIAGNOSIS — I4891 Unspecified atrial fibrillation: Secondary | ICD-10-CM | POA: Diagnosis not present

## 2016-07-12 DIAGNOSIS — Z7901 Long term (current) use of anticoagulants: Secondary | ICD-10-CM | POA: Diagnosis not present

## 2016-07-12 LAB — POCT INR: INR: 1.4

## 2016-07-19 ENCOUNTER — Ambulatory Visit (INDEPENDENT_AMBULATORY_CARE_PROVIDER_SITE_OTHER): Payer: Medicare Other | Admitting: *Deleted

## 2016-07-19 DIAGNOSIS — Z7901 Long term (current) use of anticoagulants: Secondary | ICD-10-CM | POA: Diagnosis not present

## 2016-07-19 DIAGNOSIS — I4891 Unspecified atrial fibrillation: Secondary | ICD-10-CM

## 2016-07-19 LAB — POCT INR: INR: 2.3

## 2016-08-02 ENCOUNTER — Ambulatory Visit (INDEPENDENT_AMBULATORY_CARE_PROVIDER_SITE_OTHER): Payer: Medicare Other | Admitting: *Deleted

## 2016-08-02 DIAGNOSIS — I480 Paroxysmal atrial fibrillation: Secondary | ICD-10-CM | POA: Diagnosis not present

## 2016-08-02 LAB — POCT INR: INR: 2.8

## 2016-08-23 ENCOUNTER — Ambulatory Visit (INDEPENDENT_AMBULATORY_CARE_PROVIDER_SITE_OTHER): Payer: Medicare Other | Admitting: *Deleted

## 2016-08-23 ENCOUNTER — Encounter (INDEPENDENT_AMBULATORY_CARE_PROVIDER_SITE_OTHER): Payer: Self-pay

## 2016-08-23 DIAGNOSIS — I480 Paroxysmal atrial fibrillation: Secondary | ICD-10-CM

## 2016-08-23 LAB — POCT INR: INR: 2.8

## 2016-09-20 ENCOUNTER — Ambulatory Visit (INDEPENDENT_AMBULATORY_CARE_PROVIDER_SITE_OTHER): Payer: Medicare Other | Admitting: *Deleted

## 2016-09-20 DIAGNOSIS — I480 Paroxysmal atrial fibrillation: Secondary | ICD-10-CM

## 2016-09-20 LAB — POCT INR: INR: 2.8

## 2016-10-02 ENCOUNTER — Ambulatory Visit (INDEPENDENT_AMBULATORY_CARE_PROVIDER_SITE_OTHER): Payer: Medicare Other | Admitting: *Deleted

## 2016-10-02 DIAGNOSIS — I495 Sick sinus syndrome: Secondary | ICD-10-CM

## 2016-10-02 DIAGNOSIS — I480 Paroxysmal atrial fibrillation: Secondary | ICD-10-CM

## 2016-10-02 DIAGNOSIS — Z950 Presence of cardiac pacemaker: Secondary | ICD-10-CM

## 2016-10-02 LAB — CUP PACEART INCLINIC DEVICE CHECK
Brady Statistic AP VP Percent: 7 %
Brady Statistic AS VS Percent: 2 %
Date Time Interrogation Session: 20180326040000
Implantable Lead Implant Date: 20061026
Implantable Lead Location: 753859
Implantable Lead Location: 753860
Implantable Lead Model: 4053
Implantable Lead Serial Number: 416839
Lead Channel Impedance Value: 720 Ohm
Lead Channel Pacing Threshold Amplitude: 1.3 V
Lead Channel Pacing Threshold Pulse Width: 0.5 ms
Lead Channel Sensing Intrinsic Amplitude: 10.5 mV
Lead Channel Setting Pacing Amplitude: 2.6 V
Lead Channel Setting Pacing Pulse Width: 0.8 ms
Lead Channel Setting Sensing Sensitivity: 2.5 mV
MDC IDC LEAD IMPLANT DT: 20061026
MDC IDC LEAD SERIAL: 453015
MDC IDC MSMT LEADCHNL RA IMPEDANCE VALUE: 790 Ohm
MDC IDC MSMT LEADCHNL RA PACING THRESHOLD AMPLITUDE: 0.8 V
MDC IDC MSMT LEADCHNL RV PACING THRESHOLD PULSEWIDTH: 0.8 ms
MDC IDC PG IMPLANT DT: 20061026
MDC IDC PG SERIAL: 129438
MDC IDC SET LEADCHNL RV PACING AMPLITUDE: 3.2 V
MDC IDC STAT BRADY AP VS PERCENT: 70 %
MDC IDC STAT BRADY AS VP PERCENT: 20 %
Pulse Gen Model: 1291

## 2016-10-02 NOTE — Progress Notes (Signed)
Pacemaker check in clinic (interpreter present). Normal device function. Thresholds, sensing, impedances consistent with previous measurements. Device programmed to maximize longevity. No mode switch or high ventricular rates noted. Device programmed at appropriate safety margins. Histogram distribution appropriate for patient activity level. Device programmed to optimize intrinsic conduction. Estimated longevity 1 year. ROV with GT in September. Pt concerned about [place] Mainwaring flight to [place] in April- I advised him to have his PPM ID card for security and to walk the aisle of the plane hourly to avoid DVT. I also advised them to speak with their Primary Care Provider to check about other precautions.

## 2016-11-06 ENCOUNTER — Ambulatory Visit (INDEPENDENT_AMBULATORY_CARE_PROVIDER_SITE_OTHER): Payer: Medicare Other | Admitting: *Deleted

## 2016-11-06 DIAGNOSIS — I480 Paroxysmal atrial fibrillation: Secondary | ICD-10-CM | POA: Diagnosis not present

## 2016-11-06 LAB — POCT INR: INR: 4.4

## 2016-11-20 ENCOUNTER — Ambulatory Visit (INDEPENDENT_AMBULATORY_CARE_PROVIDER_SITE_OTHER): Payer: Medicare Other | Admitting: *Deleted

## 2016-11-20 DIAGNOSIS — I4891 Unspecified atrial fibrillation: Secondary | ICD-10-CM

## 2016-11-20 DIAGNOSIS — I480 Paroxysmal atrial fibrillation: Secondary | ICD-10-CM | POA: Diagnosis not present

## 2016-11-20 LAB — POCT INR: INR: 2.7

## 2016-12-11 ENCOUNTER — Ambulatory Visit (INDEPENDENT_AMBULATORY_CARE_PROVIDER_SITE_OTHER): Payer: Medicare Other | Admitting: *Deleted

## 2016-12-11 DIAGNOSIS — I480 Paroxysmal atrial fibrillation: Secondary | ICD-10-CM | POA: Diagnosis not present

## 2016-12-11 DIAGNOSIS — I4891 Unspecified atrial fibrillation: Secondary | ICD-10-CM

## 2016-12-11 LAB — POCT INR: INR: 3.5

## 2016-12-28 ENCOUNTER — Encounter (INDEPENDENT_AMBULATORY_CARE_PROVIDER_SITE_OTHER): Payer: Self-pay

## 2016-12-28 ENCOUNTER — Ambulatory Visit (INDEPENDENT_AMBULATORY_CARE_PROVIDER_SITE_OTHER): Payer: Medicare Other | Admitting: *Deleted

## 2016-12-28 DIAGNOSIS — I4891 Unspecified atrial fibrillation: Secondary | ICD-10-CM

## 2016-12-28 DIAGNOSIS — I480 Paroxysmal atrial fibrillation: Secondary | ICD-10-CM

## 2016-12-28 LAB — POCT INR: INR: 2.2

## 2017-01-23 ENCOUNTER — Ambulatory Visit (INDEPENDENT_AMBULATORY_CARE_PROVIDER_SITE_OTHER): Payer: Medicare Other | Admitting: *Deleted

## 2017-01-23 DIAGNOSIS — I4891 Unspecified atrial fibrillation: Secondary | ICD-10-CM

## 2017-01-23 DIAGNOSIS — I480 Paroxysmal atrial fibrillation: Secondary | ICD-10-CM | POA: Diagnosis not present

## 2017-01-23 LAB — POCT INR: INR: 3.2

## 2017-02-13 ENCOUNTER — Ambulatory Visit (INDEPENDENT_AMBULATORY_CARE_PROVIDER_SITE_OTHER): Payer: Medicare Other | Admitting: *Deleted

## 2017-02-13 DIAGNOSIS — I4891 Unspecified atrial fibrillation: Secondary | ICD-10-CM | POA: Diagnosis not present

## 2017-02-13 DIAGNOSIS — I480 Paroxysmal atrial fibrillation: Secondary | ICD-10-CM | POA: Diagnosis not present

## 2017-02-13 LAB — POCT INR: INR: 2.4

## 2017-03-01 ENCOUNTER — Other Ambulatory Visit: Payer: Self-pay | Admitting: Internal Medicine

## 2017-03-13 ENCOUNTER — Ambulatory Visit (INDEPENDENT_AMBULATORY_CARE_PROVIDER_SITE_OTHER): Payer: Medicare Other | Admitting: *Deleted

## 2017-03-13 DIAGNOSIS — I4891 Unspecified atrial fibrillation: Secondary | ICD-10-CM

## 2017-03-13 DIAGNOSIS — I480 Paroxysmal atrial fibrillation: Secondary | ICD-10-CM | POA: Diagnosis not present

## 2017-03-13 DIAGNOSIS — Z5181 Encounter for therapeutic drug level monitoring: Secondary | ICD-10-CM | POA: Diagnosis not present

## 2017-03-13 LAB — POCT INR: INR: 3.3

## 2017-04-03 ENCOUNTER — Encounter: Payer: Medicare Other | Admitting: Internal Medicine

## 2017-04-13 ENCOUNTER — Other Ambulatory Visit: Payer: Self-pay | Admitting: Internal Medicine

## 2017-04-20 ENCOUNTER — Ambulatory Visit (INDEPENDENT_AMBULATORY_CARE_PROVIDER_SITE_OTHER): Payer: Medicare Other | Admitting: Internal Medicine

## 2017-04-20 ENCOUNTER — Ambulatory Visit (INDEPENDENT_AMBULATORY_CARE_PROVIDER_SITE_OTHER): Payer: Medicare Other | Admitting: *Deleted

## 2017-04-20 ENCOUNTER — Encounter: Payer: Self-pay | Admitting: Internal Medicine

## 2017-04-20 VITALS — BP 116/82 | HR 60 | Ht 70.0 in | Wt 228.0 lb

## 2017-04-20 DIAGNOSIS — I480 Paroxysmal atrial fibrillation: Secondary | ICD-10-CM | POA: Diagnosis not present

## 2017-04-20 DIAGNOSIS — I495 Sick sinus syndrome: Secondary | ICD-10-CM | POA: Diagnosis not present

## 2017-04-20 DIAGNOSIS — Z950 Presence of cardiac pacemaker: Secondary | ICD-10-CM

## 2017-04-20 DIAGNOSIS — Z5181 Encounter for therapeutic drug level monitoring: Secondary | ICD-10-CM

## 2017-04-20 LAB — CUP PACEART INCLINIC DEVICE CHECK
Brady Statistic RA Percent Paced: 51 %
Brady Statistic RV Percent Paced: 26 %
Date Time Interrogation Session: 20181012040000
Implantable Lead Implant Date: 20061026
Implantable Lead Location: 753859
Implantable Lead Location: 753860
Implantable Lead Model: 4053
Implantable Lead Model: 4054
Implantable Lead Serial Number: 453015
Lead Channel Pacing Threshold Pulse Width: 0.8 ms
Lead Channel Setting Pacing Pulse Width: 0.8 ms
MDC IDC LEAD IMPLANT DT: 20061026
MDC IDC LEAD SERIAL: 416839
MDC IDC MSMT LEADCHNL RA IMPEDANCE VALUE: 820 Ohm
MDC IDC MSMT LEADCHNL RA PACING THRESHOLD AMPLITUDE: 0.5 V
MDC IDC MSMT LEADCHNL RA PACING THRESHOLD PULSEWIDTH: 0.4 ms
MDC IDC MSMT LEADCHNL RV IMPEDANCE VALUE: 740 Ohm
MDC IDC MSMT LEADCHNL RV PACING THRESHOLD AMPLITUDE: 1.2 V
MDC IDC PG IMPLANT DT: 20061026
MDC IDC PG SERIAL: 129438
MDC IDC SET LEADCHNL RA PACING AMPLITUDE: 2.6 V
MDC IDC SET LEADCHNL RV PACING AMPLITUDE: 3.2 V
MDC IDC SET LEADCHNL RV SENSING SENSITIVITY: 2.5 mV
Pulse Gen Model: 1291

## 2017-04-20 LAB — POCT INR: INR: 2.8

## 2017-04-20 NOTE — Progress Notes (Signed)
HPI Mr. Hofman returns today for ongoing evaluation and management of sinus node dysfunction, paroxysmal atrial fibrillation, status post pacemaker insertion, and hypertension. In the interim, he has done well. His main complaint today is pain in his right lower back and right leg. No syncope. He has had no injury to the area. He is approaching elective replacement. Allergies  Allergen Reactions  . Tamiflu [Oseltamivir]     Rash and itching   . Diphenhydramine Hcl Rash  . Pheniramine-Pe-Apap Rash  . Phenylephrine-Pheniramine-Dm Rash     Current Outpatient Prescriptions  Medication Sig Dispense Refill  . amiodarone (PACERONE) 200 MG tablet TAKE 1 TABLET(200 MG) BY MOUTH DAILY 90 tablet 0  . amLODipine-benazepril (LOTREL) 5-40 MG capsule Take 1 capsule by mouth 2 (two) times daily.    . CRESTOR 20 MG tablet TAKE 1 TABLET BY MOUTH DAILY 90 tablet 0  . docusate sodium (COLACE) 100 MG capsule Take 100 mg by mouth every 12 (twelve) hours as needed. STOOL SOFTENER    . hydrochlorothiazide (HYDRODIURIL) 25 MG tablet TAKE 1 TABLET BY MOUTH EVERY DAY 90 tablet 0  . metoprolol (TOPROL-XL) 200 MG 24 hr tablet TAKE 1 TABLET BY MOUTH EVERY DAY 90 tablet 0  . NEXIUM 40 MG capsule TAKE 1 CAPSULE BY MOUTH DAILY 90 capsule 0  . omega-3 acid ethyl esters (LOVAZA) 1 G capsule Take 4 tablets by mouth daily    . orphenadrine (NORFLEX) 100 MG tablet Take 100 mg by mouth 2 (two) times daily as needed.  0  . traMADol (ULTRAM) 50 MG tablet Take 50 mg by mouth every 8 (eight) hours as needed. AS NEEDED FOR MODERATE PAIN    . warfarin (COUMADIN) 5 MG tablet TAKE AS DIRECTED BY COUMADIN CLINIC 90 tablet 0   No current facility-administered medications for this visit.      Past Medical History:  Diagnosis Date  . Atrial fibrillation (HCC)   . Hypertension   . Nephrolithiasis   . Other and unspecified hyperlipidemia   . Sinoatrial node dysfunction (HCC)     ROS:   All systems reviewed and  negative except as noted in the HPI.   Past Surgical History:  Procedure Laterality Date  . INSERT / REPLACE / REMOVE PACEMAKER     guidant 10-06     Family History  Problem Relation Age of Onset  . Heart attack Mother   . Hypertension Father      Social History   Social History  . Marital status: Married    Spouse name: N/A  . Number of children: N/A  . Years of education: N/A   Occupational History  . retired Art gallery manager Retired   Social History Main Topics  . Smoking status: Never Smoker  . Smokeless tobacco: Never Used  . Alcohol use Yes     Comment: minimal  . Drug use: No  . Sexual activity: Not on file   Other Topics Concern  . Not on file   Social History Narrative  . No narrative on file     BP 116/82   Pulse 60   Ht  (1.778 m)   Wt 228 lb (103.4 kg)   SpO2 98%   BMI 32.71 kg/m   Physical Exam:  Well appearing 76 year old man, NAD HEENT: Unremarkable Neck:  6 cm JVD, no thyromegally Lymphatics:  No adenopathy Back:  No CVA tenderness Lungs:  Clear, with no wheezes, rales, or rhonchi HEART:  Regular rate rhythm, no murmurs,  no rubs, no clicks Abd:  soft, positive bowel sounds, no organomegally, no rebound, no guarding Ext:  2 plus pulses, no edema, no cyanosis, no clubbing Skin:  No rashes no nodules Neuro:  CN II through XII intact, motor grossly intact  EKG - normal sinus rhythm with atrial pacing left ventricular hypertrophy, and left anterior fascicular block  DEVICE  Normal device function.  See PaceArt for details. He is approaching elective replacement  Assess/Plan: 1. Pacemaker - his [employer] dual-chamber pacemaker is working normally. He is approaching elective replacement. 2. Sinus node dysfunction - he is asymptomatic, status post pacemaker insertion 3. Paroxysmal atrial fibrillation - he is maintaining sinus rhythm very nicely. He will continue his current medical therapy. This includes low-dose  amiodarone.  Lewayne Bunting, M.D.

## 2017-04-20 NOTE — Patient Instructions (Addendum)
Medication Instructions:  Your physician recommends that you continue on your current medications as directed. Please refer to the Current Medication list given to you today.  Labwork: None ordered.  Testing/Procedures: None ordered.  Follow-Up:  You will follow up in 2 months with device clinic for a battery check.  This is a no charge visit.  Your physician wants you to follow-up in: one year with Dr. Ladona Ridgel.   You will receive a reminder letter in the mail two months in advance. If you don't receive a letter, please call our office to schedule the follow-up appointment.   Any Other Special Instructions Will Be Listed Below (If Applicable).   If you need a refill on your cardiac medications before your next appointment, please call your pharmacy.

## 2017-05-18 ENCOUNTER — Ambulatory Visit (INDEPENDENT_AMBULATORY_CARE_PROVIDER_SITE_OTHER): Payer: Medicare Other | Admitting: Pharmacist

## 2017-05-18 DIAGNOSIS — I480 Paroxysmal atrial fibrillation: Secondary | ICD-10-CM

## 2017-05-18 DIAGNOSIS — Z5181 Encounter for therapeutic drug level monitoring: Secondary | ICD-10-CM | POA: Diagnosis not present

## 2017-05-18 LAB — POCT INR: INR: 3.4

## 2017-06-08 ENCOUNTER — Ambulatory Visit (INDEPENDENT_AMBULATORY_CARE_PROVIDER_SITE_OTHER): Payer: Medicare Other | Admitting: *Deleted

## 2017-06-08 ENCOUNTER — Other Ambulatory Visit: Payer: Self-pay | Admitting: Internal Medicine

## 2017-06-08 DIAGNOSIS — I480 Paroxysmal atrial fibrillation: Secondary | ICD-10-CM | POA: Diagnosis not present

## 2017-06-08 DIAGNOSIS — Z5181 Encounter for therapeutic drug level monitoring: Secondary | ICD-10-CM | POA: Diagnosis not present

## 2017-06-08 LAB — POCT INR: INR: 2.7

## 2017-06-08 NOTE — Patient Instructions (Signed)
Continue taking 1/2 tablet daily except 1 tablet on Mondays, Wednesdays, and Fridays. Recheck in 12 days with pacermaker check. Call if placed on any new medications or scheduled for any procedures [phone]

## 2017-06-20 ENCOUNTER — Ambulatory Visit (INDEPENDENT_AMBULATORY_CARE_PROVIDER_SITE_OTHER): Payer: Medicare Other | Admitting: *Deleted

## 2017-06-20 ENCOUNTER — Ambulatory Visit (INDEPENDENT_AMBULATORY_CARE_PROVIDER_SITE_OTHER): Payer: Self-pay | Admitting: *Deleted

## 2017-06-20 DIAGNOSIS — Z5181 Encounter for therapeutic drug level monitoring: Secondary | ICD-10-CM | POA: Diagnosis not present

## 2017-06-20 DIAGNOSIS — I495 Sick sinus syndrome: Secondary | ICD-10-CM

## 2017-06-20 DIAGNOSIS — I480 Paroxysmal atrial fibrillation: Secondary | ICD-10-CM | POA: Diagnosis not present

## 2017-06-20 LAB — CUP PACEART INCLINIC DEVICE CHECK
Date Time Interrogation Session: 20181212113505
Implantable Lead Implant Date: 20061026
Implantable Lead Location: 753860
Implantable Lead Serial Number: 416839
MDC IDC LEAD IMPLANT DT: 20061026
MDC IDC LEAD LOCATION: 753859
MDC IDC LEAD SERIAL: 453015
MDC IDC PG IMPLANT DT: 20061026
MDC IDC PG SERIAL: 129438
Pulse Gen Model: 1291

## 2017-06-20 LAB — POCT INR: INR: 2

## 2017-06-20 NOTE — Patient Instructions (Signed)
Continue taking 1/2 tablet daily except 1 tablet on Mondays, Wednesdays, and Fridays. Recheck in 4 weeks. Call if placed on any new medications or scheduled for any procedures [phone]

## 2017-06-20 NOTE — Progress Notes (Signed)
Battery check only (N/C). Estimated longevity <0.5 years. ROV with device clinic 08/13/16.

## 2017-07-18 ENCOUNTER — Ambulatory Visit (INDEPENDENT_AMBULATORY_CARE_PROVIDER_SITE_OTHER): Payer: Medicare Other | Admitting: *Deleted

## 2017-07-18 DIAGNOSIS — Z5181 Encounter for therapeutic drug level monitoring: Secondary | ICD-10-CM | POA: Diagnosis not present

## 2017-07-18 DIAGNOSIS — I480 Paroxysmal atrial fibrillation: Secondary | ICD-10-CM

## 2017-07-18 LAB — POCT INR: INR: 2.2

## 2017-07-18 NOTE — Patient Instructions (Signed)
Description   Continue taking 1/2 tablet daily except 1 tablet on Mondays, Wednesdays, and Fridays. Recheck in 4 weeks. Call if placed on any new medications or scheduled for any procedures [phone]

## 2017-08-13 ENCOUNTER — Ambulatory Visit (INDEPENDENT_AMBULATORY_CARE_PROVIDER_SITE_OTHER): Payer: Medicare Other | Admitting: Pharmacist

## 2017-08-13 ENCOUNTER — Ambulatory Visit (INDEPENDENT_AMBULATORY_CARE_PROVIDER_SITE_OTHER): Payer: Medicare Other | Admitting: *Deleted

## 2017-08-13 DIAGNOSIS — Z5181 Encounter for therapeutic drug level monitoring: Secondary | ICD-10-CM | POA: Diagnosis not present

## 2017-08-13 DIAGNOSIS — I480 Paroxysmal atrial fibrillation: Secondary | ICD-10-CM | POA: Diagnosis not present

## 2017-08-13 DIAGNOSIS — Z950 Presence of cardiac pacemaker: Secondary | ICD-10-CM

## 2017-08-13 DIAGNOSIS — I495 Sick sinus syndrome: Secondary | ICD-10-CM

## 2017-08-13 LAB — CUP PACEART INCLINIC DEVICE CHECK
Brady Statistic RV Percent Paced: 38 %
Date Time Interrogation Session: 20190204050000
Implantable Lead Implant Date: 20061026
Implantable Lead Location: 753860
Implantable Lead Model: 4054
Implantable Lead Serial Number: 453015
Implantable Pulse Generator Implant Date: 20061026
Lead Channel Impedance Value: 720 Ohm
Lead Channel Impedance Value: 820 Ohm
Lead Channel Pacing Threshold Pulse Width: 0.5 ms
Lead Channel Sensing Intrinsic Amplitude: 0.75 mV
MDC IDC LEAD IMPLANT DT: 20061026
MDC IDC LEAD LOCATION: 753859
MDC IDC LEAD SERIAL: 416839
MDC IDC MSMT LEADCHNL RA PACING THRESHOLD AMPLITUDE: 1.2 V
MDC IDC MSMT LEADCHNL RV PACING THRESHOLD AMPLITUDE: 1.2 V
MDC IDC MSMT LEADCHNL RV PACING THRESHOLD PULSEWIDTH: 0.8 ms
MDC IDC MSMT LEADCHNL RV SENSING INTR AMPL: 9.8 mV
MDC IDC SET LEADCHNL RA PACING AMPLITUDE: 2.6 V
MDC IDC SET LEADCHNL RV PACING AMPLITUDE: 3.2 V
MDC IDC SET LEADCHNL RV PACING PULSEWIDTH: 0.8 ms
MDC IDC SET LEADCHNL RV SENSING SENSITIVITY: 2.5 mV
MDC IDC STAT BRADY RA PERCENT PACED: 35 %
Pulse Gen Serial Number: 129438

## 2017-08-13 LAB — PROTIME-INR
INR: 6.3 (ref 0.8–1.2)
Prothrombin Time: 66.8 s — ABNORMAL HIGH (ref 9.1–12.0)

## 2017-08-13 LAB — POCT INR: INR: 6.8

## 2017-08-13 NOTE — Patient Instructions (Signed)
Description   Do NOT take Coumadin until we call you with the results from the lab.

## 2017-08-13 NOTE — Progress Notes (Signed)
Pacemaker check in clinic. Normal device function. Thresholds, sensing, impedances consistent with previous measurements. Device programmed to maximize longevity. 30% mode switched- on warfarin. No high ventricular rates noted. Device programmed at appropriate safety margins. Histogram distribution appropriate for patient activity level. Device programmed to optimize intrinsic conduction. Estimated longevity <0.5 year. ROV with Device Clinic 09/17/17 for battery check (billable in May).

## 2017-08-20 ENCOUNTER — Ambulatory Visit (INDEPENDENT_AMBULATORY_CARE_PROVIDER_SITE_OTHER): Payer: Medicare Other

## 2017-08-20 DIAGNOSIS — Z5181 Encounter for therapeutic drug level monitoring: Secondary | ICD-10-CM

## 2017-08-20 DIAGNOSIS — I480 Paroxysmal atrial fibrillation: Secondary | ICD-10-CM

## 2017-08-20 LAB — POCT INR: INR: 1.4

## 2017-08-20 NOTE — Patient Instructions (Signed)
Description   Take 7.5mg  today, then resume same dosage 2.5mg  daily except 5mg  on Mondays, Wednesdays, and Fridays. Recheck in 1 week.  Call Coumadin Clinic [phone] with bleeding or medication changes.

## 2017-08-27 ENCOUNTER — Ambulatory Visit (INDEPENDENT_AMBULATORY_CARE_PROVIDER_SITE_OTHER): Payer: Medicare Other | Admitting: *Deleted

## 2017-08-27 DIAGNOSIS — Z5181 Encounter for therapeutic drug level monitoring: Secondary | ICD-10-CM

## 2017-08-27 DIAGNOSIS — I480 Paroxysmal atrial fibrillation: Secondary | ICD-10-CM | POA: Diagnosis not present

## 2017-08-27 LAB — POCT INR: INR: 2.1

## 2017-08-27 NOTE — Patient Instructions (Signed)
Description   Continue  same dosage 2.5mg  daily except 5mg  on Mondays, Wednesdays, and Fridays. Recheck in 2 weeks.  Call Coumadin Clinic [phone] with bleeding or medication changes.

## 2017-09-10 ENCOUNTER — Ambulatory Visit (INDEPENDENT_AMBULATORY_CARE_PROVIDER_SITE_OTHER): Payer: Medicare Other | Admitting: *Deleted

## 2017-09-10 DIAGNOSIS — I480 Paroxysmal atrial fibrillation: Secondary | ICD-10-CM

## 2017-09-10 DIAGNOSIS — Z5181 Encounter for therapeutic drug level monitoring: Secondary | ICD-10-CM | POA: Diagnosis not present

## 2017-09-10 LAB — POCT INR: INR: 4.5

## 2017-09-10 NOTE — Patient Instructions (Signed)
Description   Do not take coumadin today March 4th and no coumadin on March 5th then continue  same dosage 2.5mg  daily except 5mg  on Mondays, Wednesdays, and Fridays. Recheck in 1 week.  Call Coumadin Clinic [phone] with bleeding or medication changes.

## 2017-09-17 ENCOUNTER — Ambulatory Visit (INDEPENDENT_AMBULATORY_CARE_PROVIDER_SITE_OTHER): Payer: Medicare Other | Admitting: *Deleted

## 2017-09-17 ENCOUNTER — Ambulatory Visit (INDEPENDENT_AMBULATORY_CARE_PROVIDER_SITE_OTHER): Payer: Self-pay | Admitting: *Deleted

## 2017-09-17 DIAGNOSIS — I495 Sick sinus syndrome: Secondary | ICD-10-CM

## 2017-09-17 DIAGNOSIS — Z5181 Encounter for therapeutic drug level monitoring: Secondary | ICD-10-CM | POA: Diagnosis not present

## 2017-09-17 DIAGNOSIS — I480 Paroxysmal atrial fibrillation: Secondary | ICD-10-CM

## 2017-09-17 LAB — CUP PACEART INCLINIC DEVICE CHECK
Implantable Lead Location: 753860
Implantable Lead Model: 4053
Implantable Lead Model: 4054
Implantable Lead Serial Number: 453015
MDC IDC LEAD IMPLANT DT: 20061026
MDC IDC LEAD IMPLANT DT: 20061026
MDC IDC LEAD LOCATION: 753859
MDC IDC LEAD SERIAL: 416839
MDC IDC PG IMPLANT DT: 20061026
MDC IDC PG SERIAL: 129438
MDC IDC SESS DTM: 20190311101622

## 2017-09-17 LAB — POCT INR: INR: 2.2

## 2017-09-17 NOTE — Patient Instructions (Signed)
Description   Continue taking the same dosage 2.5mg  daily except 5mg  on Mondays, Wednesdays, and Fridays. Recheck in 2 week.  Call Coumadin Clinic [phone] with bleeding or medication changes.

## 2017-09-17 NOTE — Progress Notes (Signed)
Battery check, device at [facility] since 08/20/2017. ROV w/ GT 10/09/2017 to discuss gen change.

## 2017-09-24 ENCOUNTER — Encounter: Payer: Medicare Other | Admitting: Internal Medicine

## 2017-09-28 ENCOUNTER — Encounter: Payer: Self-pay | Admitting: Internal Medicine

## 2017-10-09 ENCOUNTER — Encounter: Payer: Self-pay | Admitting: Internal Medicine

## 2017-10-09 ENCOUNTER — Ambulatory Visit (INDEPENDENT_AMBULATORY_CARE_PROVIDER_SITE_OTHER): Payer: Medicare Other | Admitting: Internal Medicine

## 2017-10-09 VITALS — BP 122/82 | HR 74 | Ht 70.0 in | Wt 238.0 lb

## 2017-10-09 DIAGNOSIS — Z950 Presence of cardiac pacemaker: Secondary | ICD-10-CM

## 2017-10-09 DIAGNOSIS — I495 Sick sinus syndrome: Secondary | ICD-10-CM | POA: Diagnosis not present

## 2017-10-09 DIAGNOSIS — I480 Paroxysmal atrial fibrillation: Secondary | ICD-10-CM

## 2017-10-09 NOTE — Progress Notes (Addendum)
HPI Mr. Mertie ClauseBaklenov returns today for ongoing evaluation and management of sinus node dysfunction, paroxysmal atrial fibrillation, status post pacemaker insertion, and hypertension. In the interim, he has done well.  No syncope. He has had no injury to the area. He has reached elective replacement.  Allergies  Allergen Reactions  . Tamiflu [Oseltamivir]     Rash and itching   . Diphenhydramine Hcl Rash  . Pheniramine-Pe-Apap Rash  . Phenylephrine-Pheniramine-Dm Rash     Current Outpatient Medications  Medication Sig Dispense Refill  . amiodarone (PACERONE) 200 MG tablet TAKE 1 TABLET(200 MG) BY MOUTH DAILY 90 tablet 3  . amLODipine-benazepril (LOTREL) 5-40 MG capsule Take 1 capsule by mouth 2 (two) times daily.    . CRESTOR 20 MG tablet TAKE 1 TABLET BY MOUTH DAILY 90 tablet 0  . hydrochlorothiazide (HYDRODIURIL) 25 MG tablet TAKE 1 TABLET BY MOUTH EVERY DAY 90 tablet 0  . metoprolol (TOPROL-XL) 200 MG 24 hr tablet TAKE 1 TABLET BY MOUTH EVERY DAY 90 tablet 0  . NEXIUM 40 MG capsule TAKE 1 CAPSULE BY MOUTH DAILY 90 capsule 0  . omega-3 acid ethyl esters (LOVAZA) 1 G capsule Take 4 tablets by mouth daily    . warfarin (COUMADIN) 5 MG tablet TAKE AS DIRECTED BY COUMADIN CLINIC 90 tablet 0   No current facility-administered medications for this visit.      Past Medical History:  Diagnosis Date  . Atrial fibrillation (HCC)   . Hypertension   . Nephrolithiasis   . Other and unspecified hyperlipidemia   . Sinoatrial node dysfunction (HCC)     ROS:   All systems reviewed and negative except as noted in the HPI.   Past Surgical History:  Procedure Laterality Date  . INSERT / REPLACE / REMOVE PACEMAKER     [facility] 10-06     Family History  Problem Relation Age of Onset  . Heart attack Mother   . Hypertension Father      Social History   Socioeconomic History  . Marital status: Married    Spouse name: Not on file  . Number of children: Not on file  . Years  of education: Not on file  . Highest education level: Not on file  Occupational History  . Occupation: retired Garment/textile technologistengineer    Employer: RETIRED  Social Needs  . Financial resource strain: Not on file  . Food insecurity:    Worry: Not on file    Inability: Not on file  . Transportation needs:    Medical: Not on file    Non-medical: Not on file  Tobacco Use  . Smoking status: Never Smoker  . Smokeless tobacco: Never Used  Substance and Sexual Activity  . Alcohol use: Yes    Comment: minimal  . Drug use: No  . Sexual activity: Not on file  Lifestyle  . Physical activity:    Days per week: Not on file    Minutes per session: Not on file  . Stress: Not on file  Relationships  . Social connections:    Talks on phone: Not on file    Gets together: Not on file    Attends religious service: Not on file    Active member of club or organization: Not on file    Attends meetings of clubs or organizations: Not on file    Relationship status: Not on file  . Intimate partner violence:    Fear of current or ex partner: Not on file  Emotionally abused: Not on file    Physically abused: Not on file    Forced sexual activity: Not on file  Other Topics Concern  . Not on file  Social History Narrative  . Not on file     BP 122/82   Pulse 74   Ht 5\' 10"  (1.778 m)   Wt 238 lb (108 kg)   BMI 34.15 kg/m   Physical Exam:  Well appearing NAD HEENT: Unremarkable Neck:  No JVD, no thyromegally Lymphatics:  No adenopathy Back:  No CVA tenderness Lungs:  Clear with no wheezes HEART:  Regular rate rhythm, no murmurs, no rubs, no clicks Abd:  soft, positive bowel sounds, no organomegally, no rebound, no guarding Ext:  2 plus pulses, no edema, no cyanosis, no clubbing Skin:  No rashes no nodules Neuro:  CN II through XII intact, motor grossly intact  EKG - atrial fib with a controlled VR with occaisional pacing  DEVICE  Normal device function.  See PaceArt for details.  ERI.  Assess/Plan: 1. Atrial fib - his ventricular rate is well controlled. Will continue his current meds. I would anticipate we undergo DCCV after his PPM has been changed out. 2. PPM - his [place] DDD PM has reached ERI. He will undergo PPM gen change 3. Sinus node dysfunction - he is stable after PPM insertion.  Leonia Reeves.D.

## 2017-10-09 NOTE — Patient Instructions (Addendum)
Medication Instructions:  Your physician recommends that you continue on your current medications as directed. Please refer to the Current Medication list given to you today.  Labwork: You will get lab work today:  BMP, CBC and PT/INR.  Testing/Procedures: Your physician has recommended that you have your generator to your pacemaker replaced.  Follow-Up: You will follow up with device clinic 10-14 days after your procedure for a wound check.  You will follow up with Dr. Ladona Ridgelaylor 91 days after your procedure.  Any Other Special Instructions Will Be Listed Below (If Applicable).  [facility] at:  10:30 am on October 15, 2017 Use the CHG surgical scrub as directed Do not eat or drink after midnight prior to procedure Hold your warfarin for two days.  Your last dose will be on October 12, 2017. On the morning of your procedure you may take all your morning medications except for:  Hydrochlorothiazide (your fluid pill). You will be discharged after your procedure You will need someone to drive you home at discharge  If you need a refill on your cardiac medications before your next appointment, please call your pharmacy.

## 2017-10-09 NOTE — H&P (View-Only) (Signed)
HPI [name] returns today for ongoing evaluation and management of sinus node dysfunction, paroxysmal atrial fibrillation, status post pacemaker insertion, and hypertension. In the interim, he has done well.  No syncope. He has had no injury to the area. He has reached elective replacement.  Allergies  Allergen Reactions  . Tamiflu [Oseltamivir]     Rash and itching   . Diphenhydramine Hcl Rash  . Pheniramine-Pe-Apap Rash  . Phenylephrine-Pheniramine-Dm Rash     Current Outpatient Medications  Medication Sig Dispense Refill  . amiodarone (PACERONE) 200 MG tablet TAKE 1 TABLET(200 MG) BY MOUTH DAILY 90 tablet 3  . amLODipine-benazepril (LOTREL) 5-40 MG capsule Take 1 capsule by mouth 2 (two) times daily.    . CRESTOR 20 MG tablet TAKE 1 TABLET BY MOUTH DAILY 90 tablet 0  . hydrochlorothiazide (HYDRODIURIL) 25 MG tablet TAKE 1 TABLET BY MOUTH EVERY DAY 90 tablet 0  . metoprolol (TOPROL-XL) 200 MG 24 hr tablet TAKE 1 TABLET BY MOUTH EVERY DAY 90 tablet 0  . NEXIUM 40 MG capsule TAKE 1 CAPSULE BY MOUTH DAILY 90 capsule 0  . omega-3 acid ethyl esters (LOVAZA) 1 G capsule Take 4 tablets by mouth daily    . warfarin (COUMADIN) 5 MG tablet TAKE AS DIRECTED BY COUMADIN CLINIC 90 tablet 0   No current facility-administered medications for this visit.      Past Medical History:  Diagnosis Date  . Atrial fibrillation (HCC)   . Hypertension   . Nephrolithiasis   . Other and unspecified hyperlipidemia   . Sinoatrial node dysfunction (HCC)     ROS:   All systems reviewed and negative except as noted in the HPI.   Past Surgical History:  Procedure Laterality Date  . INSERT / REPLACE / REMOVE PACEMAKER     [facility] 10-06     Family History  Problem Relation Age of Onset  . Heart attack Mother   . Hypertension Father      Social History   Socioeconomic History  . Marital status: Married    Spouse name: Not on file  . Number of children: Not on file  . Years  of education: Not on file  . Highest education level: Not on file  Occupational History  . Occupation: retired Garment/textile technologistengineer    Employer: RETIRED  Social Needs  . Financial resource strain: Not on file  . Food insecurity:    Worry: Not on file    Inability: Not on file  . Transportation needs:    Medical: Not on file    Non-medical: Not on file  Tobacco Use  . Smoking status: Never Smoker  . Smokeless tobacco: Never Used  Substance and Sexual Activity  . Alcohol use: Yes    Comment: minimal  . Drug use: No  . Sexual activity: Not on file  Lifestyle  . Physical activity:    Days per week: Not on file    Minutes per session: Not on file  . Stress: Not on file  Relationships  . Social connections:    Talks on phone: Not on file    Gets together: Not on file    Attends religious service: Not on file    Active member of club or organization: Not on file    Attends meetings of clubs or organizations: Not on file    Relationship status: Not on file  . Intimate partner violence:    Fear of current or ex partner: Not on file  Emotionally abused: Not on file    Physically abused: Not on file    Forced sexual activity: Not on file  Other Topics Concern  . Not on file  Social History Narrative  . Not on file     BP 122/82   Pulse 74   Ht 5\' 10"  (1.778 m)   Wt 238 lb (108 kg)   BMI 34.15 kg/m   Physical Exam:  Well appearing NAD HEENT: Unremarkable Neck:  No JVD, no thyromegally Lymphatics:  No adenopathy Back:  No CVA tenderness Lungs:  Clear with no wheezes HEART:  Regular rate rhythm, no murmurs, no rubs, no clicks Abd:  soft, positive bowel sounds, no organomegally, no rebound, no guarding Ext:  2 plus pulses, no edema, no cyanosis, no clubbing Skin:  No rashes no nodules Neuro:  CN II through XII intact, motor grossly intact  EKG - atrial fib with a controlled VR with occaisional pacing  DEVICE  Normal device function.  See PaceArt for details.  ERI.  Assess/Plan: 1. Atrial fib - his ventricular rate is well controlled. Will continue his current meds. I would anticipate we undergo DCCV after his PPM has been changed out. 2. PPM - his [place] [facility] DDD PM has reached ERI. He will undergo PPM gen change 3. Sinus node dysfunction - he is stable after PPM insertion.  Leonia Reeves.D.

## 2017-10-10 ENCOUNTER — Ambulatory Visit (INDEPENDENT_AMBULATORY_CARE_PROVIDER_SITE_OTHER): Payer: Medicare Other | Admitting: Cardiology

## 2017-10-10 DIAGNOSIS — Z5181 Encounter for therapeutic drug level monitoring: Secondary | ICD-10-CM | POA: Diagnosis not present

## 2017-10-10 LAB — CBC WITH DIFFERENTIAL/PLATELET
BASOS ABS: 0 10*3/uL (ref 0.0–0.2)
Basos: 0 %
EOS (ABSOLUTE): 0.1 10*3/uL (ref 0.0–0.4)
Eos: 1 %
Hematocrit: 52.8 % — ABNORMAL HIGH (ref 37.5–51.0)
Hemoglobin: 17.6 g/dL (ref 13.0–17.7)
Immature Grans (Abs): 0 10*3/uL (ref 0.0–0.1)
Immature Granulocytes: 0 %
LYMPHS ABS: 3.6 10*3/uL — AB (ref 0.7–3.1)
LYMPHS: 35 %
MCH: 31.9 pg (ref 26.6–33.0)
MCHC: 33.3 g/dL (ref 31.5–35.7)
MCV: 96 fL (ref 79–97)
MONOCYTES: 7 %
Monocytes Absolute: 0.7 10*3/uL (ref 0.1–0.9)
Neutrophils Absolute: 5.9 10*3/uL (ref 1.4–7.0)
Neutrophils: 57 %
PLATELETS: 432 10*3/uL — AB (ref 150–379)
RBC: 5.51 x10E6/uL (ref 4.14–5.80)
RDW: 13.8 % (ref 12.3–15.4)
WBC: 10.3 10*3/uL (ref 3.4–10.8)

## 2017-10-10 LAB — BASIC METABOLIC PANEL
BUN / CREAT RATIO: 18 (ref 10–24)
BUN: 26 mg/dL (ref 8–27)
CALCIUM: 10.1 mg/dL (ref 8.6–10.2)
CHLORIDE: 101 mmol/L (ref 96–106)
CO2: 27 mmol/L (ref 20–29)
Creatinine, Ser: 1.47 mg/dL — ABNORMAL HIGH (ref 0.76–1.27)
GFR calc Af Amer: 52 mL/min/{1.73_m2} — ABNORMAL LOW (ref >59)
GFR calc non Af Amer: 45 mL/min/{1.73_m2} — ABNORMAL LOW (ref >59)
GLUCOSE: 105 mg/dL — AB (ref 65–99)
Potassium: 4.6 mmol/L (ref 3.5–5.2)
Sodium: 142 mmol/L (ref 134–144)

## 2017-10-10 LAB — PROTIME-INR
INR: 4.2 — AB (ref 0.8–1.2)
PROTHROMBIN TIME: 40.5 s — AB (ref 9.1–12.0)

## 2017-10-10 NOTE — Patient Instructions (Signed)
Description   Spoke with pt's wife and gave instruction to hold coumadin today April 3rd and tomorrow April 4th take 2.5mg  and on Friday April 5th take coumadin 2.5mg   Last day to take coumadin is Friday April 5th then no coumadin on Saturday April 6th and no coumadin on April 7th Procedure is on April 8th after procedure when instructed by MD doing procedure to restart coumadin restart at same dose  2.5mg  daily except 5mg  on Mondays, Wednesdays, and Fridays. Recheck in 1 week.  Call Coumadin Clinic [phone] with bleeding or medication changes.

## 2017-10-10 NOTE — Addendum Note (Signed)
Addended by: Micki RileySHOFFNER, Jazzmon Prindle C on: 10/10/2017 04:24 PM   Modules accepted: Orders

## 2017-10-15 ENCOUNTER — Ambulatory Visit (HOSPITAL_COMMUNITY)
Admission: RE | Disposition: A | Discharge status: Home / Self Care | Payer: Self-pay | Source: Ambulatory Visit | Attending: Internal Medicine

## 2017-10-15 ENCOUNTER — Ambulatory Visit (HOSPITAL_COMMUNITY)
Admission: RE | Admit: 2017-10-15 | Discharge: 2017-10-15 | Disposition: A | Discharge status: Home / Self Care | Payer: Medicare Other | Source: Ambulatory Visit | Attending: Internal Medicine | Admitting: Internal Medicine

## 2017-10-15 DIAGNOSIS — I495 Sick sinus syndrome: Secondary | ICD-10-CM | POA: Insufficient documentation

## 2017-10-15 DIAGNOSIS — I480 Paroxysmal atrial fibrillation: Secondary | ICD-10-CM | POA: Insufficient documentation

## 2017-10-15 DIAGNOSIS — E785 Hyperlipidemia, unspecified: Secondary | ICD-10-CM | POA: Diagnosis not present

## 2017-10-15 DIAGNOSIS — Z8249 Family history of ischemic heart disease and other diseases of the circulatory system: Secondary | ICD-10-CM | POA: Insufficient documentation

## 2017-10-15 DIAGNOSIS — Z45010 Encounter for checking and testing of cardiac pacemaker pulse generator [battery]: Secondary | ICD-10-CM | POA: Diagnosis not present

## 2017-10-15 DIAGNOSIS — I10 Essential (primary) hypertension: Secondary | ICD-10-CM | POA: Insufficient documentation

## 2017-10-15 DIAGNOSIS — Z7901 Long term (current) use of anticoagulants: Secondary | ICD-10-CM | POA: Diagnosis not present

## 2017-10-15 HISTORY — PX: PPM GENERATOR CHANGEOUT: EP1233

## 2017-10-15 LAB — SURGICAL PCR SCREEN
MRSA, PCR: NEGATIVE
Staphylococcus aureus: POSITIVE — AB

## 2017-10-15 LAB — GLUCOSE, CAPILLARY: Glucose-Capillary: 119 mg/dL — ABNORMAL HIGH (ref 65–99)

## 2017-10-15 LAB — PROTIME-INR
INR: 1.86
Prothrombin Time: 21.3 seconds — ABNORMAL HIGH (ref 11.4–15.2)

## 2017-10-15 SURGERY — PPM GENERATOR CHANGEOUT

## 2017-10-15 MED ORDER — CEFAZOLIN SODIUM-DEXTROSE 2-4 GM/100ML-% IV SOLN
2.0000 g | INTRAVENOUS | Status: AC
  Administered 2017-10-15: 2 g via INTRAVENOUS

## 2017-10-15 MED ORDER — LIDOCAINE HCL (PF) 1 % IJ SOLN
INTRAMUSCULAR | Status: DC | PRN
  Administered 2017-10-15: 45 mL

## 2017-10-15 MED ORDER — LIDOCAINE HCL 1 % IJ SOLN
INTRAMUSCULAR | Status: AC
  Filled 2017-10-15: qty 20

## 2017-10-15 MED ORDER — SODIUM CHLORIDE 0.9 % IV SOLN
INTRAVENOUS | Status: DC
  Administered 2017-10-15: 11:00:00 via INTRAVENOUS

## 2017-10-15 MED ORDER — CHLORHEXIDINE GLUCONATE 4 % EX LIQD
60.0000 mL | Freq: Once | CUTANEOUS | Status: DC
  Filled 2017-10-15: qty 60

## 2017-10-15 MED ORDER — MUPIROCIN 2 % EX OINT
TOPICAL_OINTMENT | CUTANEOUS | Status: AC
Start: 2017-10-15 — End: 2017-10-15
  Administered 2017-10-15: 1 via TOPICAL
  Filled 2017-10-15: qty 22

## 2017-10-15 MED ORDER — CEFAZOLIN SODIUM-DEXTROSE 2-4 GM/100ML-% IV SOLN
INTRAVENOUS | Status: AC
  Filled 2017-10-15: qty 100

## 2017-10-15 MED ORDER — ACETAMINOPHEN 325 MG PO TABS: 325.0000 mg | ORAL_TABLET | ORAL | Status: DC | PRN

## 2017-10-15 MED ORDER — ONDANSETRON HCL 4 MG/2ML IJ SOLN: 4.0000 mg | Freq: Four times a day (QID) | INTRAMUSCULAR | Status: DC | PRN

## 2017-10-15 MED ORDER — SODIUM CHLORIDE 0.9 % IR SOLN
80.0000 mg | Status: AC
  Administered 2017-10-15: 80 mg

## 2017-10-15 MED ORDER — MUPIROCIN 2 % EX OINT
1.0000 "application " | TOPICAL_OINTMENT | Freq: Once | CUTANEOUS | Status: AC
  Administered 2017-10-15: 1 via TOPICAL
  Filled 2017-10-15: qty 22

## 2017-10-15 MED ORDER — SODIUM CHLORIDE 0.9 % IV SOLN
INTRAVENOUS | Status: AC
  Filled 2017-10-15: qty 2

## 2017-10-15 SURGICAL SUPPLY — 4 items
CABLE SURGICAL S-101-97-12 (CABLE) ×2 IMPLANT
PACEMAKER ACCOLADE DR-EL (Pacemaker) ×2 IMPLANT
PAD DEFIB LIFELINK (PAD) ×2 IMPLANT
TRAY PACEMAKER INSERTION (PACKS) ×2 IMPLANT

## 2017-10-15 NOTE — Interval H&P Note (Signed)
History and Physical Interval Note:  10/15/2017 5:24 PM  Clarice PoleVladimir Smouse  has presented today for surgery, with the diagnosis of eri  The various methods of treatment have been discussed with the patient and family. After consideration of risks, benefits and other options for treatment, the patient has consented to  Procedure(s): PPM GENERATOR CHANGEOUT (N/A) as a surgical intervention .  The patient's history has been reviewed, patient examined, no change in status, stable for surgery.  I have reviewed the patient's chart and labs.  Questions were answered to the patient's satisfaction.     Lewayne BuntingGregg Taylor

## 2017-10-15 NOTE — Discharge Instructions (Signed)
**Note Jacob Skinner-Identified via Obfuscation** Pacemaker Battery Change, Care After This sheet gives you information about how to care for yourself after your procedure. Your health care provider may also give you more specific instructions. If you have problems or questions, contact your health care provider. What can I expect after the procedure? After your procedure, it is common to have:  Pain or soreness at the site where the pacemaker was inserted.  Swelling at the site where the pacemaker was inserted.  Follow these instructions at home: Incision care  Keep the incision clean and dry. ? Do not take baths, swim, or use a hot tub for 7 days.  ? You may shower the day after your procedure, or as directed by your health care provider. ? Pat the area dry with a clean towel. Do not rub the area. This may cause bleeding.  Follow instructions from your health care provider about how to take care of your incision. Make sure you: ? Wash your hands with soap and water before you change your bandage (dressing). If soap and water are not available, use hand sanitizer. ? Change your dressing as told by your health care provider. ? Leave stitches (sutures), skin glue, or adhesive strips in place. These skin closures may need to stay in place for 2 weeks or longer. If adhesive strip edges start to loosen and curl up, you may trim the loose edges. Do not remove adhesive strips completely unless your health care provider tells you to do that.  Check your incision area every day for signs of infection. Check for: ? More redness, swelling, or pain. ? More fluid or blood. ? Warmth. ? Pus or a bad smell. Activity  Do not lift anything that is heavier than 10 lb (4.5 kg) until your health care provider says it is okay to do so.  For the first 2 weeks, or as long as told by your health care provider: ? Avoid lifting your left arm higher than your shoulder. ? Be gentle when you move your arms over your head. It is okay to raise your arm to comb  your hair. ? Avoid strenuous exercise.  Ask your health care provider when it is okay to: ? Resume your normal activities. ? Return to work or school. ? Resume sexual activity. Eating and drinking  Eat a heart-healthy diet. This should include plenty of fresh fruits and vegetables, whole grains, low-fat dairy products, and lean protein like chicken and fish.  Limit alcohol intake to no more than 1 drink a day for non-pregnant women and 2 drinks a day for men. One drink equals 12 oz of beer, 5 oz of wine, or 1 oz of hard liquor.  Check ingredients and nutrition facts on packaged foods and beverages. Avoid the following types of food: ? Food that is high in salt (sodium). ? Food that is high in saturated fat, like full-fat dairy or red meat. ? Food that is high in trans fat, like fried food. ? Food and drinks that are high in sugar. Lifestyle  Do not use any products that contain nicotine or tobacco, such as cigarettes and e-cigarettes. If you need help quitting, ask your health care provider.  Take steps to manage and control your weight.  Get regular exercise. Aim for 150 minutes of moderate-intensity exercise (such as walking or yoga) or 75 minutes of vigorous exercise (such as running or swimming) each week.  Manage other health problems, such as diabetes or high blood pressure. Ask your health care provider **Note Jacob Skinner-Identified via Obfuscation** how you can manage these conditions. General instructions  Do not drive for 24 hours after your procedure if you were given a medicine to help you relax (sedative).  Take over-the-counter and prescription medicines only as told by your health care provider.  Avoid putting pressure on the area where the pacemaker was placed.  If you need an MRI after your pacemaker has been placed, be sure to tell the health care provider who orders the MRI that you have a pacemaker.  Avoid close and prolonged exposure to electrical devices that have strong magnetic fields. These  include: ? Cell phones. Avoid keeping them in a pocket near the pacemaker, and try using the ear opposite the pacemaker. ? MP3 players. ? Household appliances, like microwaves. ? Metal detectors. ? Electric generators. ? High-tension wires.  Keep all follow-up visits as directed by your health care provider. This is important. Contact a health care provider if:  You have pain at the incision site that is not relieved by over-the-counter or prescription medicines.  You have any of these around your incision site or coming from it: ? More redness, swelling, or pain. ? Fluid or blood. ? Warmth to the touch. ? Pus or a bad smell.  You have a fever.  You feel brief, occasional palpitations, light-headedness, or any symptoms that you think might be related to your heart. Get help right away if:  You experience chest pain that is different from the pain at the pacemaker site.  You develop a red streak that extends above or below the incision site.  You experience shortness of breath.  You have palpitations or an irregular heartbeat.  You have light-headedness that does not go away quickly.  You faint or have dizzy spells.  Your pulse suddenly drops or increases rapidly and does not return to normal.  You begin to gain weight and your legs and ankles swell. Summary  After your procedure, it is common to have pain, soreness, and some swelling where the pacemaker was inserted.  Make sure to keep your incision clean and dry. Follow instructions from your health care provider about how to take care of your incision.  Check your incision every day for signs of infection, such as more pain or swelling, pus or a bad smell, warmth, or leaking fluid and blood.  Avoid strenuous exercise and lifting your left arm higher than your shoulder for 2 weeks, or as long as told by your health care provider. This information is not intended to replace advice given to you by your health care  provider. Make sure you discuss any questions you have with your health care provider. Document Released: 04/16/2013 Document Revised: 05/18/2016 Document Reviewed: 05/18/2016 Elsevier Interactive Patient Education  2017 [employer].

## 2017-10-16 ENCOUNTER — Encounter (HOSPITAL_COMMUNITY): Payer: Self-pay | Admitting: Internal Medicine

## 2017-10-16 MED FILL — Lidocaine HCl Local Inj 1%: INTRAMUSCULAR | Qty: 20 | Status: AC

## 2017-10-17 ENCOUNTER — Telehealth: Payer: Self-pay | Admitting: Internal Medicine

## 2017-10-17 NOTE — Telephone Encounter (Signed)
New message     Patient is returning call from someone , they did not leave a message on what it was regarding , husband just had procedure done on April 8,2019

## 2017-10-17 NOTE — Telephone Encounter (Signed)
This nurse did not call Pt. Spoke with coumadin clinic, they did not call Pt. Unsure who called.  No action needed.

## 2017-10-22 ENCOUNTER — Ambulatory Visit (INDEPENDENT_AMBULATORY_CARE_PROVIDER_SITE_OTHER): Payer: Medicare Other | Admitting: *Deleted

## 2017-10-22 DIAGNOSIS — Z5181 Encounter for therapeutic drug level monitoring: Secondary | ICD-10-CM

## 2017-10-22 DIAGNOSIS — I4891 Unspecified atrial fibrillation: Secondary | ICD-10-CM | POA: Diagnosis not present

## 2017-10-22 LAB — POCT INR: INR: 2.2

## 2017-10-22 NOTE — Patient Instructions (Signed)
Description   Continue taking  2.5mg  daily except 5mg  on Mondays, Wednesdays, and Fridays. Recheck in 1 week.  Call Coumadin Clinic [phone] with bleeding or medication changes.

## 2017-10-29 ENCOUNTER — Ambulatory Visit (INDEPENDENT_AMBULATORY_CARE_PROVIDER_SITE_OTHER): Payer: Medicare Other | Admitting: *Deleted

## 2017-10-29 DIAGNOSIS — Z5181 Encounter for therapeutic drug level monitoring: Secondary | ICD-10-CM | POA: Diagnosis not present

## 2017-10-29 DIAGNOSIS — I480 Paroxysmal atrial fibrillation: Secondary | ICD-10-CM

## 2017-10-29 DIAGNOSIS — I4891 Unspecified atrial fibrillation: Secondary | ICD-10-CM | POA: Diagnosis not present

## 2017-10-29 LAB — CUP PACEART INCLINIC DEVICE CHECK
Implantable Lead Implant Date: 20061026
Implantable Lead Location: 753859
Implantable Lead Model: 4053
Implantable Lead Model: 4054
Implantable Lead Serial Number: 416839
Lead Channel Pacing Threshold Pulse Width: 0.4 ms
Lead Channel Setting Pacing Amplitude: 3 V
Lead Channel Setting Pacing Pulse Width: 0.4 ms
Lead Channel Setting Sensing Sensitivity: 3 mV
MDC IDC LEAD IMPLANT DT: 20061026
MDC IDC LEAD LOCATION: 753860
MDC IDC LEAD SERIAL: 453015
MDC IDC MSMT LEADCHNL RA PACING THRESHOLD AMPLITUDE: 1 V
MDC IDC MSMT LEADCHNL RA PACING THRESHOLD PULSEWIDTH: 0.4 ms
MDC IDC MSMT LEADCHNL RV PACING THRESHOLD AMPLITUDE: 1.7 V
MDC IDC MSMT LEADCHNL RV SENSING INTR AMPL: 13.4 mV
MDC IDC PG IMPLANT DT: 20190408
MDC IDC SESS DTM: 20190422040000
MDC IDC SET LEADCHNL RA PACING AMPLITUDE: 2.5 V
Pulse Gen Serial Number: 822581

## 2017-10-29 LAB — POCT INR: INR: 3.6

## 2017-10-29 NOTE — Patient Instructions (Signed)
Description   Skip today's dose, then Continue taking  2.5mg  daily except 5mg  on Mondays, Wednesdays, and Fridays. Recheck in 11 days.   Call Coumadin Clinic [phone] with bleeding or medication changes.

## 2017-10-29 NOTE — Progress Notes (Signed)
Wound check appointment. Steri-strips removed. Wound without redness or edema. Incision edges approximated, wound well healed. Normal device function. Thresholds, sensing, and impedances consistent with implant measurements. Device programmed at chronic values s/p gen change. Histogram distribution appropriate for patient and level of activity. No mode switches or high ventricular rates noted. Patient educated about wound care, arm mobility, lifting restrictions. ROV in 3 months with GT

## 2017-11-09 ENCOUNTER — Encounter (INDEPENDENT_AMBULATORY_CARE_PROVIDER_SITE_OTHER): Payer: Self-pay

## 2017-11-09 ENCOUNTER — Ambulatory Visit (INDEPENDENT_AMBULATORY_CARE_PROVIDER_SITE_OTHER): Payer: Medicare Other | Admitting: Pharmacist

## 2017-11-09 DIAGNOSIS — I480 Paroxysmal atrial fibrillation: Secondary | ICD-10-CM

## 2017-11-09 DIAGNOSIS — Z5181 Encounter for therapeutic drug level monitoring: Secondary | ICD-10-CM

## 2017-11-09 LAB — POCT INR: INR: 2.5

## 2017-11-09 NOTE — Patient Instructions (Signed)
Description   Continue taking  2.5mg  daily except  on Mondays, Wednesdays, and Fridays. Recheck in 4 weeks. Call Coumadin Clinic [phone] with bleeding or medication changes.

## 2017-12-10 ENCOUNTER — Encounter (INDEPENDENT_AMBULATORY_CARE_PROVIDER_SITE_OTHER): Payer: Self-pay

## 2017-12-10 ENCOUNTER — Ambulatory Visit (INDEPENDENT_AMBULATORY_CARE_PROVIDER_SITE_OTHER): Payer: Medicare Other | Admitting: *Deleted

## 2017-12-10 DIAGNOSIS — Z5181 Encounter for therapeutic drug level monitoring: Secondary | ICD-10-CM

## 2017-12-10 DIAGNOSIS — I480 Paroxysmal atrial fibrillation: Secondary | ICD-10-CM | POA: Diagnosis not present

## 2017-12-10 LAB — POCT INR: INR: 4.1 — AB (ref 2.0–3.0)

## 2017-12-10 NOTE — Patient Instructions (Signed)
Description   Hold today's dose then continue taking 2.5mg  daily except 5mg  on Mondays, Wednesdays, and Fridays. Recheck in 2 weeks. Call Coumadin Clinic [phone] with bleeding or medication changes.

## 2017-12-26 ENCOUNTER — Ambulatory Visit (INDEPENDENT_AMBULATORY_CARE_PROVIDER_SITE_OTHER): Payer: Medicare Other | Admitting: *Deleted

## 2017-12-26 DIAGNOSIS — I480 Paroxysmal atrial fibrillation: Secondary | ICD-10-CM

## 2017-12-26 DIAGNOSIS — Z5181 Encounter for therapeutic drug level monitoring: Secondary | ICD-10-CM | POA: Diagnosis not present

## 2017-12-26 LAB — POCT INR: INR: 2.8 (ref 2.0–3.0)

## 2017-12-26 NOTE — Patient Instructions (Signed)
Description   Continue taking 2.5mg  daily except 5mg  on Mondays, Wednesdays, and Fridays. Recheck in 3 weeks. Call Coumadin Clinic [phone] with bleeding or medication changes.

## 2018-01-23 ENCOUNTER — Encounter: Payer: Self-pay | Admitting: Internal Medicine

## 2018-01-23 ENCOUNTER — Encounter (INDEPENDENT_AMBULATORY_CARE_PROVIDER_SITE_OTHER): Payer: Self-pay

## 2018-01-23 ENCOUNTER — Ambulatory Visit (INDEPENDENT_AMBULATORY_CARE_PROVIDER_SITE_OTHER): Payer: Medicare Other | Admitting: Internal Medicine

## 2018-01-23 ENCOUNTER — Ambulatory Visit (INDEPENDENT_AMBULATORY_CARE_PROVIDER_SITE_OTHER): Payer: Medicare Other | Admitting: *Deleted

## 2018-01-23 VITALS — BP 132/64 | HR 60 | Ht 70.0 in | Wt 238.0 lb

## 2018-01-23 DIAGNOSIS — Z950 Presence of cardiac pacemaker: Secondary | ICD-10-CM | POA: Diagnosis not present

## 2018-01-23 DIAGNOSIS — I495 Sick sinus syndrome: Secondary | ICD-10-CM

## 2018-01-23 DIAGNOSIS — Z5181 Encounter for therapeutic drug level monitoring: Secondary | ICD-10-CM

## 2018-01-23 DIAGNOSIS — I480 Paroxysmal atrial fibrillation: Secondary | ICD-10-CM

## 2018-01-23 LAB — CUP PACEART INCLINIC DEVICE CHECK
Brady Statistic RA Percent Paced: 49 %
Brady Statistic RV Percent Paced: 35 %
Date Time Interrogation Session: 20190717040000
Implantable Lead Implant Date: 20061026
Implantable Lead Location: 753859
Implantable Lead Location: 753860
Implantable Lead Serial Number: 416839
Implantable Lead Serial Number: 453015
Lead Channel Impedance Value: 652 Ohm
Lead Channel Pacing Threshold Amplitude: 1.4 V
Lead Channel Pacing Threshold Amplitude: 1.6 V
Lead Channel Pacing Threshold Pulse Width: 0.6 ms
Lead Channel Sensing Intrinsic Amplitude: 15.6 mV
Lead Channel Sensing Intrinsic Amplitude: 3.6 mV
Lead Channel Setting Pacing Pulse Width: 0.4 ms
MDC IDC LEAD IMPLANT DT: 20061026
MDC IDC MSMT LEADCHNL RA IMPEDANCE VALUE: 623 Ohm
MDC IDC MSMT LEADCHNL RA PACING THRESHOLD PULSEWIDTH: 0.4 ms
MDC IDC MSMT LEADCHNL RV PACING THRESHOLD AMPLITUDE: 1.7 V
MDC IDC MSMT LEADCHNL RV PACING THRESHOLD PULSEWIDTH: 0.4 ms
MDC IDC PG IMPLANT DT: 20190408
MDC IDC SET LEADCHNL RA PACING AMPLITUDE: 3 V
MDC IDC SET LEADCHNL RV PACING AMPLITUDE: 3 V
MDC IDC SET LEADCHNL RV SENSING SENSITIVITY: 3 mV
Pulse Gen Serial Number: 822581

## 2018-01-23 LAB — POCT INR: INR: 2.8 (ref 2.0–3.0)

## 2018-01-23 NOTE — Progress Notes (Addendum)
HPI Mr. Freer today for ongoing evaluation and management of sinus node dysfunction, paroxysmal atrial fibrillation, status post pacemaker insertion, and hypertension. In the interim, he has done well.  No syncope. He has undergone PPM generator change out. Since then he has done well. He has occaisional palpitations. Minimal edema.  Allergies  Allergen Reactions  . Diphenhydramine Hcl Rash  . Pheniramine-Pe-Apap Rash  . Phenylephrine-Pheniramine-Dm Rash  . Tamiflu [Oseltamivir] Itching and Rash     Current Outpatient Medications  Medication Sig Dispense Refill  . amiodarone (PACERONE) 200 MG tablet TAKE 1 TABLET(200 MG) BY MOUTH DAILY 90 tablet 3  . amLODipine-benazepril (LOTREL) 5-40 MG capsule Take 1 capsule by mouth 2 (two) times daily.    . CRESTOR 20 MG tablet TAKE 1 TABLET BY MOUTH DAILY (Patient taking differently: TAKE 1 TABLET (20mg ) BY MOUTH DAILY) 90 tablet 0  . hydrochlorothiazide (HYDRODIURIL) 25 MG tablet TAKE 1 TABLET BY MOUTH EVERY DAY (Patient taking differently: TAKE 1 TABLET (25mg ) BY MOUTH EVERY DAY) 90 tablet 0  . metoprolol (TOPROL-XL) 200 MG 24 hr tablet TAKE 1 TABLET BY MOUTH EVERY DAY (Patient taking differently: TAKE 1 TABLET (200mg ) BY MOUTH EVERY DAY) 90 tablet 0  . NEXIUM 40 MG capsule TAKE 1 CAPSULE BY MOUTH DAILY (Patient taking differently: TAKE 1 CAPSULE (40mg ) BY MOUTH DAILY) 90 capsule 0  . omega-3 acid ethyl esters (LOVAZA) 1 G capsule Take 2 g by mouth daily.     Marland Kitchen warfarin (COUMADIN) 5 MG tablet TAKE AS DIRECTED BY COUMADIN CLINIC 90 tablet 0   No current facility-administered medications for this visit.      Past Medical History:  Diagnosis Date  . Atrial fibrillation (HCC)   . Hypertension   . Nephrolithiasis   . Other and unspecified hyperlipidemia   . Sinoatrial node dysfunction (HCC)     ROS:   All systems reviewed and negative except as noted in the HPI.   Past Surgical History:  Procedure Laterality Date  .  INSERT / REPLACE / REMOVE PACEMAKER     guidant 10-06  . PPM GENERATOR CHANGEOUT N/A 10/15/2017   Procedure: PPM GENERATOR CHANGEOUT;  Surgeon: Marinus Maw, MD;  Location: [facility] INVASIVE CV LAB;  Service: Cardiovascular;  Laterality: N/A;     Family History  Problem Relation Age of Onset  . Heart attack Mother   . Hypertension Father      Social History   Socioeconomic History  . Marital status: Married    Spouse name: Not on file  . Number of children: Not on file  . Years of education: Not on file  . Highest education level: Not on file  Occupational History  . Occupation: retired Garment/textile technologist: RETIRED  Social Needs  . Financial resource strain: Not on file  . Food insecurity:    Worry: Not on file    Inability: Not on file  . Transportation needs:    Medical: Not on file    Non-medical: Not on file  Tobacco Use  . Smoking status: Never Smoker  . Smokeless tobacco: Never Used  Substance and Sexual Activity  . Alcohol use: Yes    Comment: minimal  . Drug use: No  . Sexual activity: Not on file  Lifestyle  . Physical activity:    Days per week: Not on file    Minutes per session: Not on file  . Stress: Not on file  Relationships  . Social connections:  Talks on phone: Not on file    Gets together: Not on file    Attends religious service: Not on file    Active member of club or organization: Not on file    Attends meetings of clubs or organizations: Not on file    Relationship status: Not on file  . Intimate partner violence:    Fear of current or ex partner: Not on file    Emotionally abused: Not on file    Physically abused: Not on file    Forced sexual activity: Not on file  Other Topics Concern  . Not on file  Social History Narrative  . Not on file     Ht 5\' 10"  (1.778 m)   BMI 33.72 kg/m   Physical Exam:  Well appearing 77 yo man, NAD HEENT: Unremarkable Neck:  6 cm JVD, no thyromegally Lymphatics:  No adenopathy Back:  No CVA  tenderness Lungs:  Clear with no wheezes, well healed PPM incision HEART:  Regular rate rhythm, no murmurs, no rubs, no clicks Abd:  soft, positive bowel sounds, no organomegally, no rebound, no guarding Ext:  2 plus pulses, no edema, no cyanosis, no clubbing Skin:  No rashes no nodules Neuro:  CN II through XII intact, motor grossly intact  EKG - nsr with atrial pacing  DEVICE  Normal device function.  See PaceArt for details.   Assess/Plan: 1. Sinus node dysfunction - he is asymptomatic, s/p PPM insertion. 2. PM - his [place] [facility] DDD PM is working normally. 3. HTN - his blood pressure remains well controlled. He is encouraged to maintain a low sodium diet. 4. PAF - he is minimally symptomatic. He will continue amio 200 daily and warfarin  Leonia ReevesGregg Macdonald Rigor,M.D.

## 2018-01-23 NOTE — Patient Instructions (Signed)
Description   Continue taking 2.5mg  daily except 5mg  on Mondays, Wednesdays, and Fridays. Recheck in 4 weeks. Call Coumadin Clinic [phone] with bleeding or medication changes.

## 2018-01-23 NOTE — Patient Instructions (Signed)
Medication Instructions:  Your physician recommends that you continue on your current medications as directed. Please refer to the Current Medication list given to you today.  Labwork: None ordered.  Testing/Procedures: None ordered.  Follow-Up: Your physician wants you to follow-up in: 9 months with Dr. Ladona Ridgelaylor.   You will receive a reminder letter in the mail two months in advance. If you don't receive a letter, please call our office to schedule the follow-up appointment.  Remote monitoring is used to monitor your Pacemaker from home. This monitoring reduces the number of office visits required to check your device to one time per year. It allows us to keep an eye on the functioning of your device to ensure it is working properly. You are scheduled for a device check from home on 01/30/2018. You may send your transmission at any time that day. If you have a wireless device, the transmission will be sent automatically. After your physician reviews your transmission, you will receive a postcard with your next transmission date.  Any Other Special Instructions Will Be Listed Below (If Applicable).  If you need a refill on your cardiac medications before your next appointment, please call your pharmacy.

## 2018-01-30 ENCOUNTER — Ambulatory Visit (INDEPENDENT_AMBULATORY_CARE_PROVIDER_SITE_OTHER): Payer: Medicare Other | Admitting: *Deleted

## 2018-01-30 DIAGNOSIS — I4891 Unspecified atrial fibrillation: Secondary | ICD-10-CM

## 2018-01-30 NOTE — Progress Notes (Signed)
Remote pacemaker transmission.   

## 2018-02-20 ENCOUNTER — Ambulatory Visit (INDEPENDENT_AMBULATORY_CARE_PROVIDER_SITE_OTHER): Payer: Medicare Other | Admitting: Pharmacist

## 2018-02-20 DIAGNOSIS — I480 Paroxysmal atrial fibrillation: Secondary | ICD-10-CM | POA: Diagnosis not present

## 2018-02-20 DIAGNOSIS — Z5181 Encounter for therapeutic drug level monitoring: Secondary | ICD-10-CM | POA: Diagnosis not present

## 2018-02-20 LAB — POCT INR: INR: 2.7 (ref 2.0–3.0)

## 2018-02-20 NOTE — Patient Instructions (Signed)
Description   Continue taking 2.5mg  daily except 5mg  on Mondays, Wednesdays, and Fridays. Recheck in 6 weeks. Call Coumadin Clinic [phone] with bleeding or medication changes.

## 2018-03-11 LAB — CUP PACEART REMOTE DEVICE CHECK
Battery Remaining Longevity: 138 mo
Battery Remaining Percentage: 100 %
Date Time Interrogation Session: 20190722095900
Implantable Lead Implant Date: 20061026
Implantable Lead Location: 753859
Implantable Lead Location: 753860
Implantable Lead Serial Number: 416839
Implantable Lead Serial Number: 453015
Implantable Pulse Generator Implant Date: 20190408
Lead Channel Pacing Threshold Amplitude: 2 V
Lead Channel Pacing Threshold Pulse Width: 0.4 ms
Lead Channel Setting Pacing Amplitude: 3 V
Lead Channel Setting Pacing Pulse Width: 0.4 ms
Lead Channel Setting Sensing Sensitivity: 3 mV
MDC IDC LEAD IMPLANT DT: 20061026
MDC IDC MSMT LEADCHNL RA IMPEDANCE VALUE: 615 Ohm
MDC IDC MSMT LEADCHNL RA PACING THRESHOLD AMPLITUDE: 1.4 V
MDC IDC MSMT LEADCHNL RA PACING THRESHOLD PULSEWIDTH: 0.4 ms
MDC IDC MSMT LEADCHNL RV IMPEDANCE VALUE: 640 Ohm
MDC IDC SET LEADCHNL RV PACING AMPLITUDE: 3 V
MDC IDC STAT BRADY RA PERCENT PACED: 55 %
MDC IDC STAT BRADY RV PERCENT PACED: 34 %
Pulse Gen Serial Number: 822581

## 2018-04-08 ENCOUNTER — Ambulatory Visit (INDEPENDENT_AMBULATORY_CARE_PROVIDER_SITE_OTHER): Payer: Medicare Other

## 2018-04-08 ENCOUNTER — Encounter (INDEPENDENT_AMBULATORY_CARE_PROVIDER_SITE_OTHER): Payer: Self-pay

## 2018-04-08 DIAGNOSIS — I480 Paroxysmal atrial fibrillation: Secondary | ICD-10-CM | POA: Diagnosis not present

## 2018-04-08 DIAGNOSIS — Z5181 Encounter for therapeutic drug level monitoring: Secondary | ICD-10-CM

## 2018-04-08 LAB — POCT INR: INR: 3.6 — AB (ref 2.0–3.0)

## 2018-04-08 NOTE — Patient Instructions (Addendum)
Description   Skip today's dosage of Coumadin, then resume same dosage 2.5mg  daily except 5mg  on Mondays, Wednesdays, and Fridays. Recheck in 3 weeks. Call Coumadin Clinic [phone] with bleeding or medication changes.

## 2018-05-01 ENCOUNTER — Ambulatory Visit (INDEPENDENT_AMBULATORY_CARE_PROVIDER_SITE_OTHER): Payer: Medicare Other | Admitting: *Deleted

## 2018-05-01 DIAGNOSIS — I480 Paroxysmal atrial fibrillation: Secondary | ICD-10-CM

## 2018-05-01 DIAGNOSIS — I495 Sick sinus syndrome: Secondary | ICD-10-CM | POA: Diagnosis not present

## 2018-05-01 DIAGNOSIS — Z5181 Encounter for therapeutic drug level monitoring: Secondary | ICD-10-CM

## 2018-05-01 LAB — POCT INR: INR: 3.7 — AB (ref 2.0–3.0)

## 2018-05-01 NOTE — Progress Notes (Signed)
Remote pacemaker transmission.   

## 2018-05-01 NOTE — Patient Instructions (Signed)
Description   Skip today's dosage of Coumadin, then change dose to 2.5mg  daily except 5mg  on Mondays  and Fridays. Recheck in 2 weeks. Call Coumadin Clinic [phone] with bleeding or medication changes.

## 2018-05-15 ENCOUNTER — Ambulatory Visit (INDEPENDENT_AMBULATORY_CARE_PROVIDER_SITE_OTHER): Payer: Medicare Other | Admitting: *Deleted

## 2018-05-15 DIAGNOSIS — Z5181 Encounter for therapeutic drug level monitoring: Secondary | ICD-10-CM | POA: Diagnosis not present

## 2018-05-15 DIAGNOSIS — I480 Paroxysmal atrial fibrillation: Secondary | ICD-10-CM | POA: Diagnosis not present

## 2018-05-15 LAB — POCT INR: INR: 2.2 (ref 2.0–3.0)

## 2018-05-15 NOTE — Patient Instructions (Signed)
Description   Continue taking 2.5mg  daily except 5mg  on Mondays and Fridays. Recheck in 3 weeks. Call Coumadin Clinic [phone] with bleeding or medication changes.

## 2018-06-05 ENCOUNTER — Ambulatory Visit (INDEPENDENT_AMBULATORY_CARE_PROVIDER_SITE_OTHER): Payer: Medicare Other | Admitting: *Deleted

## 2018-06-05 ENCOUNTER — Encounter: Payer: Self-pay | Admitting: Cardiovascular Disease

## 2018-06-05 DIAGNOSIS — I480 Paroxysmal atrial fibrillation: Secondary | ICD-10-CM | POA: Diagnosis not present

## 2018-06-05 DIAGNOSIS — Z5181 Encounter for therapeutic drug level monitoring: Secondary | ICD-10-CM | POA: Diagnosis not present

## 2018-06-05 LAB — POCT INR: INR: 2.8 (ref 2.0–3.0)

## 2018-06-05 NOTE — Telephone Encounter (Signed)
Opened in error

## 2018-06-05 NOTE — Patient Instructions (Signed)
Description   Continue taking 2.5mg  daily except 5mg  on Mondays and Fridays. Recheck in 4 weeks. Call Coumadin Clinic [phone] with bleeding or medication changes.

## 2018-07-02 ENCOUNTER — Ambulatory Visit (INDEPENDENT_AMBULATORY_CARE_PROVIDER_SITE_OTHER): Payer: Medicare Other

## 2018-07-02 DIAGNOSIS — Z5181 Encounter for therapeutic drug level monitoring: Secondary | ICD-10-CM

## 2018-07-02 DIAGNOSIS — I480 Paroxysmal atrial fibrillation: Secondary | ICD-10-CM | POA: Diagnosis not present

## 2018-07-02 LAB — POCT INR: INR: 4.1 — AB (ref 2.0–3.0)

## 2018-07-02 NOTE — Patient Instructions (Signed)
Description   Skip today's dosage of Coumadin, then start taking 2.5mg  daily except 5mg  on Mondays. Recheck in 2 weeks. Call Coumadin Clinic [phone] with bleeding or medication changes.

## 2018-07-06 LAB — CUP PACEART REMOTE DEVICE CHECK
Date Time Interrogation Session: 20191228174617
Implantable Lead Implant Date: 20061026
Implantable Lead Implant Date: 20061026
Implantable Lead Location: 753859
Implantable Lead Model: 4053
Implantable Lead Serial Number: 416839
MDC IDC LEAD LOCATION: 753860
MDC IDC LEAD SERIAL: 453015
MDC IDC PG IMPLANT DT: 20190408
Pulse Gen Serial Number: 822581

## 2018-07-23 ENCOUNTER — Ambulatory Visit (INDEPENDENT_AMBULATORY_CARE_PROVIDER_SITE_OTHER): Payer: Medicare Other | Admitting: Pharmacist

## 2018-07-23 DIAGNOSIS — Z5181 Encounter for therapeutic drug level monitoring: Secondary | ICD-10-CM | POA: Diagnosis not present

## 2018-07-23 DIAGNOSIS — I480 Paroxysmal atrial fibrillation: Secondary | ICD-10-CM

## 2018-07-23 LAB — POCT INR: INR: 2.8 (ref 2.0–3.0)

## 2018-07-23 NOTE — Patient Instructions (Signed)
Description   Start taking 1/2 tablet daily except 1 whole tablet on Mondays. Recheck in 2 weeks. Call Coumadin Clinic [phone] with bleeding or medication changes.

## 2018-07-31 ENCOUNTER — Ambulatory Visit (INDEPENDENT_AMBULATORY_CARE_PROVIDER_SITE_OTHER): Payer: Medicare Other

## 2018-07-31 DIAGNOSIS — I4891 Unspecified atrial fibrillation: Secondary | ICD-10-CM | POA: Diagnosis not present

## 2018-08-01 LAB — CUP PACEART REMOTE DEVICE CHECK
Battery Remaining Longevity: 126 mo
Battery Remaining Percentage: 100 %
Brady Statistic RA Percent Paced: 49 %
Brady Statistic RV Percent Paced: 32 %
Date Time Interrogation Session: 20200122094100
Implantable Lead Implant Date: 20061026
Implantable Lead Implant Date: 20061026
Implantable Lead Location: 753859
Implantable Lead Location: 753860
Implantable Lead Model: 4053
Implantable Lead Model: 4054
Implantable Lead Serial Number: 416839
Implantable Lead Serial Number: 453015
Implantable Pulse Generator Implant Date: 20190408
Lead Channel Impedance Value: 614 Ohm
Lead Channel Impedance Value: 638 Ohm
Lead Channel Pacing Threshold Amplitude: 1.4 V
Lead Channel Pacing Threshold Amplitude: 2 V
Lead Channel Pacing Threshold Pulse Width: 0.4 ms
Lead Channel Pacing Threshold Pulse Width: 0.4 ms
Lead Channel Setting Pacing Amplitude: 3 V
Lead Channel Setting Pacing Amplitude: 3 V
Lead Channel Setting Pacing Pulse Width: 0.4 ms
Lead Channel Setting Sensing Sensitivity: 3 mV
Pulse Gen Serial Number: 822581

## 2018-08-01 NOTE — Progress Notes (Signed)
Remote pacemaker transmission.   

## 2018-08-02 ENCOUNTER — Encounter: Payer: Self-pay | Admitting: Cardiology

## 2018-08-06 ENCOUNTER — Ambulatory Visit (INDEPENDENT_AMBULATORY_CARE_PROVIDER_SITE_OTHER): Payer: Medicare Other

## 2018-08-06 DIAGNOSIS — I480 Paroxysmal atrial fibrillation: Secondary | ICD-10-CM

## 2018-08-06 DIAGNOSIS — Z5181 Encounter for therapeutic drug level monitoring: Secondary | ICD-10-CM

## 2018-08-06 LAB — POCT INR: INR: 3.1 — AB (ref 2.0–3.0)

## 2018-08-06 NOTE — Patient Instructions (Signed)
Please have a large serving of greens today and continue taking 1/2 tablet daily except 1 whole tablet on Mondays. Recheck in 3 weeks. Call Coumadin Clinic [phone] with bleeding or medication changes.

## 2018-08-28 ENCOUNTER — Ambulatory Visit (INDEPENDENT_AMBULATORY_CARE_PROVIDER_SITE_OTHER): Payer: Medicare Other | Admitting: *Deleted

## 2018-08-28 DIAGNOSIS — I480 Paroxysmal atrial fibrillation: Secondary | ICD-10-CM | POA: Diagnosis not present

## 2018-08-28 DIAGNOSIS — Z5181 Encounter for therapeutic drug level monitoring: Secondary | ICD-10-CM

## 2018-08-28 LAB — POCT INR: INR: 3.4 — AB (ref 2.0–3.0)

## 2018-08-28 NOTE — Patient Instructions (Signed)
Description   Do not take any Coumadin today then start taking 1/2 tablet daily.  Recheck in 3 weeks. Call Coumadin Clinic [phone] with bleeding or medication changes.

## 2018-09-18 ENCOUNTER — Other Ambulatory Visit: Payer: Self-pay

## 2018-09-18 ENCOUNTER — Ambulatory Visit (INDEPENDENT_AMBULATORY_CARE_PROVIDER_SITE_OTHER): Payer: Medicare Other | Admitting: *Deleted

## 2018-09-18 DIAGNOSIS — I480 Paroxysmal atrial fibrillation: Secondary | ICD-10-CM | POA: Diagnosis not present

## 2018-09-18 DIAGNOSIS — Z5181 Encounter for therapeutic drug level monitoring: Secondary | ICD-10-CM | POA: Diagnosis not present

## 2018-09-18 LAB — POCT INR: INR: 3.1 — AB (ref 2.0–3.0)

## 2018-09-18 NOTE — Patient Instructions (Signed)
Description   Today take 1/2 tablet then start taking 1/2 tablet daily.  Recheck in 3 weeks. Call Coumadin Clinic [phone] with bleeding or medication changes.

## 2018-10-22 ENCOUNTER — Encounter: Payer: Medicare Other | Admitting: Internal Medicine

## 2018-10-25 ENCOUNTER — Telehealth: Payer: Self-pay | Admitting: Internal Medicine

## 2018-10-25 NOTE — Telephone Encounter (Signed)
New message   Spoke with pt wife about appt on 04.22.20 with Dr. Ladona Ridgel. Pt set up for phone call with Dr. Ladona Ridgel.

## 2018-10-30 ENCOUNTER — Other Ambulatory Visit: Payer: Self-pay

## 2018-10-30 ENCOUNTER — Telehealth (INDEPENDENT_AMBULATORY_CARE_PROVIDER_SITE_OTHER): Payer: Medicare Other | Admitting: Internal Medicine

## 2018-10-30 ENCOUNTER — Ambulatory Visit (INDEPENDENT_AMBULATORY_CARE_PROVIDER_SITE_OTHER): Payer: Medicare Other | Admitting: *Deleted

## 2018-10-30 DIAGNOSIS — Z950 Presence of cardiac pacemaker: Secondary | ICD-10-CM

## 2018-10-30 DIAGNOSIS — I495 Sick sinus syndrome: Secondary | ICD-10-CM | POA: Diagnosis not present

## 2018-10-30 DIAGNOSIS — I480 Paroxysmal atrial fibrillation: Secondary | ICD-10-CM | POA: Diagnosis not present

## 2018-10-30 LAB — CUP PACEART REMOTE DEVICE CHECK
Battery Remaining Longevity: 108 mo
Battery Remaining Percentage: 100 %
Brady Statistic RA Percent Paced: 31 %
Brady Statistic RV Percent Paced: 49 %
Date Time Interrogation Session: 20200422084100
Implantable Lead Implant Date: 20061026
Implantable Lead Implant Date: 20061026
Implantable Lead Location: 753859
Implantable Lead Location: 753860
Implantable Lead Model: 4053
Implantable Lead Model: 4054
Implantable Lead Serial Number: 416839
Implantable Lead Serial Number: 453015
Implantable Pulse Generator Implant Date: 20190408
Lead Channel Impedance Value: 616 Ohm
Lead Channel Impedance Value: 632 Ohm
Lead Channel Pacing Threshold Amplitude: 2.1 V
Lead Channel Pacing Threshold Pulse Width: 0.4 ms
Lead Channel Setting Pacing Amplitude: 3 V
Lead Channel Setting Pacing Amplitude: 3 V
Lead Channel Setting Pacing Pulse Width: 0.4 ms
Lead Channel Setting Sensing Sensitivity: 3 mV
Pulse Gen Serial Number: 822581

## 2018-10-30 NOTE — Progress Notes (Signed)
Electrophysiology TeleHealth Note   Due to national recommendations of social distancing due to COVID 19, an audio/video telehealth visit is felt to be most appropriate for this patient at this time.  See MyChart message from today for the patient's consent to telehealth for [employer]. He could not perform the audio portion of our visit due to technical difficulties.   Date:  10/30/2018   ID:  Duke Mullineaux, DOB 12/23/1940, MRN [id number]  Location: patient's home  Provider location: [address] [place]  Evaluation Performed: Follow-up visit  PCP:  Verl Bangs, MD  Cardiologist:  No primary care provider on file. none Electrophysiologist:  Dr Ladona Ridgel  Chief Complaint:  "He's been doing well".   History of Present Illness:    Holston Gartman is a 78 y.o. male who presents via audio/video conferencing for a telehealth visit today. He is a pleasant 78 yo man with PAF, remote WPW, heart block, s/p PPM insertion. He has been on amiodarone. He has gradually had more atrial fib although his HR has been controlled. He underwent PPM gen change out almost a year ago. The history is provided by his wife.  Since last being seen in our clinic, the patient reports doing very well.  Today, he denies symptoms of palpitations, chest pain, shortness of breath,  lower extremity edema, dizziness, presyncope, or syncope.  The patient is otherwise without complaint today.  The patient denies symptoms of fevers, chills, cough, or new SOB worrisome for COVID 19.  Past Medical History:  Diagnosis Date  . Atrial fibrillation (HCC)   . Hypertension   . Nephrolithiasis   . Other and unspecified hyperlipidemia   . Sinoatrial node dysfunction (HCC)     Past Surgical History:  Procedure Laterality Date  . INSERT / REPLACE / REMOVE PACEMAKER     guidant 10-06  . PPM GENERATOR CHANGEOUT N/A 10/15/2017   Procedure: PPM GENERATOR CHANGEOUT;  Surgeon: Marinus Maw, MD;   Location: [facility] INVASIVE CV LAB;  Service: Cardiovascular;  Laterality: N/A;    Current Outpatient Medications  Medication Sig Dispense Refill  . amiodarone (PACERONE) 200 MG tablet TAKE 1 TABLET(200 MG) BY MOUTH DAILY 90 tablet 3  . amLODipine-benazepril (LOTREL) 5-40 MG capsule Take 1 capsule by mouth 2 (two) times daily.    . CRESTOR 20 MG tablet TAKE 1 TABLET BY MOUTH DAILY (Patient taking differently: TAKE 1 TABLET (20mg ) BY MOUTH DAILY) 90 tablet 0  . hydrochlorothiazide (HYDRODIURIL) 25 MG tablet TAKE 1 TABLET BY MOUTH EVERY DAY (Patient taking differently: TAKE 1 TABLET (25mg ) BY MOUTH EVERY DAY) 90 tablet 0  . metoprolol (TOPROL-XL) 200 MG 24 hr tablet TAKE 1 TABLET BY MOUTH EVERY DAY (Patient taking differently: TAKE 1 TABLET (200mg ) BY MOUTH EVERY DAY) 90 tablet 0  . NEXIUM 40 MG capsule TAKE 1 CAPSULE BY MOUTH DAILY (Patient taking differently: TAKE 1 CAPSULE (40mg ) BY MOUTH DAILY) 90 capsule 0  . omega-3 acid ethyl esters (LOVAZA) 1 G capsule Take 2 g by mouth daily.     Marland Kitchen warfarin (COUMADIN) 5 MG tablet TAKE AS DIRECTED BY COUMADIN CLINIC 90 tablet 0   No current facility-administered medications for this visit.     Allergies:   Diphenhydramine hcl; Pheniramine-pe-apap; Phenylephrine-pheniramine-dm; and Tamiflu [oseltamivir]   Social History:  The patient  reports that he has never smoked. He has never used smokeless tobacco. He reports current alcohol use. He reports that he does not use drugs.   Family History:  The patient's  family history includes Heart attack in his mother; Hypertension in his father.   ROS:  Please see the history of present illness.   All other systems are personally reviewed and negative.    Exam:    Vital Signs:  There were no vitals taken for this visit.  Well appearing, alert and conversant, regular work of breathing,  good skin color Eyes- anicteric, neuro- grossly intact, skin- no apparent rash or lesions or cyanosis, mouth- oral mucosa is pink    Labs/Other Tests and Data Reviewed:    Recent Labs: No results found for requested labs within last 8760 hours.   Wt Readings from Last 3 Encounters:  01/23/18 238 lb (108 kg)  10/15/17 235 lb (106.6 kg)  10/09/17 238 lb (108 kg)     Other studies personally reviewed:  Last device remote is reviewed from PaceART PDF dated today which reveals normal device function, no arrhythmias except for atrial fib   ASSESSMENT & PLAN:    1.  Persistent/paroxsymal atrial fib - he is in fib increasingly frequently. He is asymptomatic. He will continue his current meds. 2. PPM - interogation of his device remotely demonstrates normal function. 3.COVID 19 screen The patient denies symptoms of COVID 19 at this time.  The importance of social distancing was discussed today.  Follow-up:  6 months in the office with me Next remote: 3 months  Current medicines are reviewed at length with the patient today.   The patient does not have concerns regarding his medicines.  The following changes were made today:  none  Labs/ tests ordered today include: none No orders of the defined types were placed in this encounter.    Patient Risk:  after full review of this patients clinical status, I feel that they are at moderate risk at this time.  Today, I have spent 15 minutes with the patient with telehealth technology discussing above.    Signed, Lewayne BuntingGregg Arnitra Sokoloski, MD  10/30/2018 1:58 PM     [facility] [address] [place] [address] [phone] (office) [phone] (fax)

## 2018-11-07 ENCOUNTER — Encounter: Payer: Self-pay | Admitting: Cardiology

## 2018-11-07 NOTE — Progress Notes (Signed)
Remote pacemaker transmission.   

## 2018-11-11 ENCOUNTER — Telehealth: Payer: Self-pay

## 2018-11-11 NOTE — Telephone Encounter (Signed)

## 2018-11-12 ENCOUNTER — Ambulatory Visit (INDEPENDENT_AMBULATORY_CARE_PROVIDER_SITE_OTHER): Payer: Medicare Other | Admitting: Pharmacist

## 2018-11-12 DIAGNOSIS — I480 Paroxysmal atrial fibrillation: Secondary | ICD-10-CM

## 2018-11-12 DIAGNOSIS — Z5181 Encounter for therapeutic drug level monitoring: Secondary | ICD-10-CM | POA: Diagnosis not present

## 2018-11-12 LAB — POCT INR: INR: 4 — AB (ref 2.0–3.0)

## 2018-11-13 ENCOUNTER — Other Ambulatory Visit: Payer: Self-pay

## 2018-11-29 ENCOUNTER — Telehealth: Payer: Self-pay

## 2018-11-29 NOTE — Telephone Encounter (Signed)
Unable to lmom

## 2018-12-03 ENCOUNTER — Other Ambulatory Visit: Payer: Self-pay

## 2018-12-03 ENCOUNTER — Ambulatory Visit (INDEPENDENT_AMBULATORY_CARE_PROVIDER_SITE_OTHER): Payer: Medicare Other | Admitting: Pharmacist

## 2018-12-03 DIAGNOSIS — Z5181 Encounter for therapeutic drug level monitoring: Secondary | ICD-10-CM

## 2018-12-03 DIAGNOSIS — I480 Paroxysmal atrial fibrillation: Secondary | ICD-10-CM

## 2018-12-03 LAB — POCT INR: INR: 2.3 (ref 2.0–3.0)

## 2018-12-19 ENCOUNTER — Telehealth: Payer: Self-pay

## 2018-12-19 NOTE — Telephone Encounter (Signed)
lmom for prescreen  

## 2018-12-25 ENCOUNTER — Ambulatory Visit (INDEPENDENT_AMBULATORY_CARE_PROVIDER_SITE_OTHER): Payer: Medicare Other | Admitting: *Deleted

## 2018-12-25 ENCOUNTER — Other Ambulatory Visit: Payer: Self-pay

## 2018-12-25 DIAGNOSIS — I480 Paroxysmal atrial fibrillation: Secondary | ICD-10-CM | POA: Diagnosis not present

## 2018-12-25 DIAGNOSIS — Z5181 Encounter for therapeutic drug level monitoring: Secondary | ICD-10-CM

## 2018-12-25 LAB — POCT INR: INR: 2.6 (ref 2.0–3.0)

## 2018-12-25 NOTE — Patient Instructions (Signed)
Continue taking 1/2 tablet daily.  Recheck in 4 weeks. Call Coumadin Clinic [phone] with bleeding or medication changes.  Call [phone] to make appointment with Dr. Lovena Le

## 2019-01-20 ENCOUNTER — Telehealth: Payer: Self-pay | Admitting: Internal Medicine

## 2019-01-20 NOTE — Telephone Encounter (Signed)

## 2019-01-21 ENCOUNTER — Telehealth: Payer: Self-pay

## 2019-01-21 ENCOUNTER — Encounter: Payer: Self-pay | Admitting: Internal Medicine

## 2019-01-21 ENCOUNTER — Other Ambulatory Visit: Payer: Self-pay

## 2019-01-21 ENCOUNTER — Telehealth: Payer: Medicare Other | Admitting: Internal Medicine

## 2019-01-21 NOTE — Telephone Encounter (Signed)
lmom for prescreen  

## 2019-01-24 ENCOUNTER — Ambulatory Visit (INDEPENDENT_AMBULATORY_CARE_PROVIDER_SITE_OTHER): Payer: Medicare Other | Admitting: *Deleted

## 2019-01-24 ENCOUNTER — Other Ambulatory Visit: Payer: Self-pay

## 2019-01-24 DIAGNOSIS — I480 Paroxysmal atrial fibrillation: Secondary | ICD-10-CM

## 2019-01-24 LAB — POCT INR: INR: 1.3 — AB (ref 2.0–3.0)

## 2019-01-24 NOTE — Patient Instructions (Addendum)
Description   Take 1 tablet today and tomorrow, then continue taking 1/2 tablet daily.  Recheck in 1 week. Call Coumadin Clinic [phone] with bleeding or medication changes.  Discussed changing to [place]. Patient wants to speak to Dr. Lovena Le first and has an appointment to speak with Dr. Lovena Le on 02/07/19

## 2019-01-29 ENCOUNTER — Telehealth: Payer: Self-pay

## 2019-01-29 ENCOUNTER — Ambulatory Visit (INDEPENDENT_AMBULATORY_CARE_PROVIDER_SITE_OTHER): Payer: Medicare Other | Admitting: *Deleted

## 2019-01-29 DIAGNOSIS — I495 Sick sinus syndrome: Secondary | ICD-10-CM

## 2019-01-29 DIAGNOSIS — I480 Paroxysmal atrial fibrillation: Secondary | ICD-10-CM

## 2019-01-29 LAB — CUP PACEART REMOTE DEVICE CHECK
Battery Remaining Longevity: 102 mo
Battery Remaining Percentage: 100 %
Brady Statistic RA Percent Paced: 26 %
Brady Statistic RV Percent Paced: 58 %
Date Time Interrogation Session: 20200722084100
Implantable Lead Implant Date: 20061026
Implantable Lead Implant Date: 20061026
Implantable Lead Location: 753859
Implantable Lead Location: 753860
Implantable Lead Model: 4053
Implantable Lead Model: 4054
Implantable Lead Serial Number: 416839
Implantable Lead Serial Number: 453015
Implantable Pulse Generator Implant Date: 20190408
Lead Channel Impedance Value: 619 Ohm
Lead Channel Impedance Value: 621 Ohm
Lead Channel Pacing Threshold Amplitude: 1.9 V
Lead Channel Pacing Threshold Pulse Width: 0.4 ms
Lead Channel Setting Pacing Amplitude: 3 V
Lead Channel Setting Pacing Amplitude: 3 V
Lead Channel Setting Pacing Pulse Width: 0.4 ms
Lead Channel Setting Sensing Sensitivity: 3 mV
Pulse Gen Serial Number: 822581

## 2019-01-29 NOTE — Telephone Encounter (Signed)

## 2019-01-31 ENCOUNTER — Ambulatory Visit (INDEPENDENT_AMBULATORY_CARE_PROVIDER_SITE_OTHER): Payer: Medicare Other | Admitting: *Deleted

## 2019-01-31 ENCOUNTER — Other Ambulatory Visit: Payer: Self-pay

## 2019-01-31 DIAGNOSIS — I480 Paroxysmal atrial fibrillation: Secondary | ICD-10-CM

## 2019-01-31 DIAGNOSIS — Z5181 Encounter for therapeutic drug level monitoring: Secondary | ICD-10-CM

## 2019-01-31 LAB — POCT INR: INR: 2.7 (ref 2.0–3.0)

## 2019-01-31 NOTE — Patient Instructions (Signed)
Description   Continue taking 1/2 tablet daily.  Recheck in 2 weeks. Call Coumadin Clinic [phone] with bleeding or medication changes. Discussed changing to [place]. Patient wants to speak to Dr. Lovena Le first.

## 2019-02-06 ENCOUNTER — Telehealth: Payer: Self-pay

## 2019-02-06 NOTE — Telephone Encounter (Signed)
   Wife will accompany pt to interpret for him...she also answered No to all questions.   COVID-19 Pre-Screening Questions:  . In the past 7 to 10 days have you had a cough,  shortness of breath, headache, congestion, fever (100 or greater) body aches, chills, sore throat, or sudden loss of taste or sense of smell? NO . Have you been around anyone with known Covid 19. NO . Have you been around anyone who is awaiting Covid 19 test results in the past 7 to 10 days? NO Have you been around anyone who has been exposed to Covid 19, or has mentioned symptoms of Covid 19 within the past 7 to 10 days? NO  If you have any concerns/questions about symptoms patients report during screening (either on the phone or at threshold). Contact the provider seeing the patient or DOD for further guidance.  If neither are available contact a member of the leadership team.

## 2019-02-07 ENCOUNTER — Other Ambulatory Visit: Payer: Self-pay

## 2019-02-07 ENCOUNTER — Ambulatory Visit (INDEPENDENT_AMBULATORY_CARE_PROVIDER_SITE_OTHER): Payer: Medicare Other | Admitting: Internal Medicine

## 2019-02-07 ENCOUNTER — Encounter: Payer: Self-pay | Admitting: Internal Medicine

## 2019-02-07 VITALS — BP 108/74 | HR 71 | Ht 70.0 in | Wt 230.0 lb

## 2019-02-07 DIAGNOSIS — Z950 Presence of cardiac pacemaker: Secondary | ICD-10-CM

## 2019-02-07 DIAGNOSIS — I480 Paroxysmal atrial fibrillation: Secondary | ICD-10-CM

## 2019-02-07 DIAGNOSIS — I495 Sick sinus syndrome: Secondary | ICD-10-CM | POA: Diagnosis not present

## 2019-02-07 DIAGNOSIS — I10 Essential (primary) hypertension: Secondary | ICD-10-CM

## 2019-02-07 LAB — CUP PACEART INCLINIC DEVICE CHECK
Brady Statistic RA Percent Paced: 26 %
Brady Statistic RV Percent Paced: 59 %
Date Time Interrogation Session: 20200731040000
Implantable Lead Implant Date: 20061026
Implantable Lead Implant Date: 20061026
Implantable Lead Location: 753859
Implantable Lead Location: 753860
Implantable Lead Model: 4053
Implantable Lead Model: 4054
Implantable Lead Serial Number: 416839
Implantable Lead Serial Number: 453015
Implantable Pulse Generator Implant Date: 20190408
Lead Channel Impedance Value: 635 Ohm
Lead Channel Impedance Value: 647 Ohm
Lead Channel Pacing Threshold Amplitude: 1.4 V
Lead Channel Pacing Threshold Amplitude: 1.5 V
Lead Channel Pacing Threshold Pulse Width: 0.6 ms
Lead Channel Pacing Threshold Pulse Width: 0.6 ms
Lead Channel Sensing Intrinsic Amplitude: 1.1 mV
Lead Channel Sensing Intrinsic Amplitude: 19 mV
Lead Channel Setting Pacing Amplitude: 3 V
Lead Channel Setting Pacing Amplitude: 3 V
Lead Channel Setting Pacing Pulse Width: 0.6 ms
Lead Channel Setting Sensing Sensitivity: 3 mV
Pulse Gen Serial Number: 822581

## 2019-02-07 LAB — CBC WITH DIFFERENTIAL/PLATELET
Basophils Absolute: 0 10*3/uL (ref 0.0–0.2)
Basos: 0 %
EOS (ABSOLUTE): 0.1 10*3/uL (ref 0.0–0.4)
Eos: 1 %
Hematocrit: 48.5 % (ref 37.5–51.0)
Hemoglobin: 15.7 g/dL (ref 13.0–17.7)
Immature Grans (Abs): 0 10*3/uL (ref 0.0–0.1)
Immature Granulocytes: 0 %
Lymphocytes Absolute: 2.1 10*3/uL (ref 0.7–3.1)
Lymphs: 28 %
MCH: 30.4 pg (ref 26.6–33.0)
MCHC: 32.4 g/dL (ref 31.5–35.7)
MCV: 94 fL (ref 79–97)
Monocytes Absolute: 0.8 10*3/uL (ref 0.1–0.9)
Monocytes: 11 %
Neutrophils Absolute: 4.4 10*3/uL (ref 1.4–7.0)
Neutrophils: 60 %
Platelets: 332 10*3/uL (ref 150–450)
RBC: 5.17 x10E6/uL (ref 4.14–5.80)
RDW: 14 % (ref 11.6–15.4)
WBC: 7.3 10*3/uL (ref 3.4–10.8)

## 2019-02-07 LAB — BASIC METABOLIC PANEL
BUN/Creatinine Ratio: 17 (ref 10–24)
BUN: 26 mg/dL (ref 8–27)
CO2: 25 mmol/L (ref 20–29)
Calcium: 9.8 mg/dL (ref 8.6–10.2)
Chloride: 101 mmol/L (ref 96–106)
Creatinine, Ser: 1.53 mg/dL — ABNORMAL HIGH (ref 0.76–1.27)
GFR calc Af Amer: 50 mL/min/{1.73_m2} — ABNORMAL LOW (ref >59)
GFR calc non Af Amer: 43 mL/min/{1.73_m2} — ABNORMAL LOW (ref >59)
Glucose: 105 mg/dL — ABNORMAL HIGH (ref 65–99)
Potassium: 4.2 mmol/L (ref 3.5–5.2)
Sodium: 141 mmol/L (ref 134–144)

## 2019-02-07 MED ORDER — APIXABAN 5 MG PO TABS: 5.0000 mg | ORAL_TABLET | Freq: Two times a day (BID) | ORAL | 11 refills | Status: DC

## 2019-02-07 NOTE — Patient Instructions (Addendum)
Medication Instructions:  Your physician has recommended you make the following change in your medication:   1.  Stop taking warfarin NOW  2.  Start taking Eliquis 5 mg on Monday February 10, 2019---Take one tablet by mouth TWICE a day.  Labwork: You will get lab work today:  BMP and [place] will get lab work in 2 months on April 14, 2019 at the [facility] office any time between 8:00 am and 4:30 pm.  You will not need to be fasting.  Testing/Procedures: None ordered.  Follow-Up: Your physician wants you to follow-up in: 6 months with Dr. Lovena Le.   You will receive a reminder letter in the mail two months in advance. If you don't receive a letter, please call our office to schedule the follow-up appointment.  Remote monitoring is used to monitor your Pacemaker from home. This monitoring reduces the number of office visits required to check your device to one time per year. It allows [place] to keep an eye on the functioning of your device to ensure it is working properly. You are scheduled for a device check from home on 05/12/2019. You may send your transmission at any time that day. If you have a wireless device, the transmission will be sent automatically. After your physician reviews your transmission, you will receive a postcard with your next transmission date.  Any Other Special Instructions Will Be Listed Below (If Applicable).  If you need a refill on your cardiac medications before your next appointment, please call your pharmacy.

## 2019-02-07 NOTE — Progress Notes (Signed)
HPI [name] returns today for followup of atrial fib and CHB, s/p PPM insertion. The patient has been maintained on coumadin but would like to stop and switch to an OAC. He has not had syncope. He has occaisional falls. He denies chest pain or sob or edema.  Allergies  Allergen Reactions  . Diphenhydramine Hcl Rash  . Pheniramine-Pe-Apap Rash  . Phenylephrine-Pheniramine-Dm Rash  . Tamiflu [Oseltamivir] Itching and Rash     Current Outpatient Medications  Medication Sig Dispense Refill  . amiodarone (PACERONE) 200 MG tablet TAKE 1 TABLET(200 MG) BY MOUTH DAILY 90 tablet 3  . amLODipine-benazepril (LOTREL) 5-40 MG capsule Take 1 capsule by mouth 2 (two) times daily.    . CRESTOR 20 MG tablet TAKE 1 TABLET BY MOUTH DAILY (Patient taking differently: TAKE 1 TABLET (20mg ) BY MOUTH DAILY) 90 tablet 0  . hydrochlorothiazide (HYDRODIURIL) 25 MG tablet TAKE 1 TABLET BY MOUTH EVERY DAY (Patient taking differently: TAKE 1 TABLET (25mg ) BY MOUTH EVERY DAY) 90 tablet 0  . metoprolol (TOPROL-XL) 200 MG 24 hr tablet TAKE 1 TABLET BY MOUTH EVERY DAY (Patient taking differently: TAKE 1 TABLET (200mg ) BY MOUTH EVERY DAY) 90 tablet 0  . NEXIUM 40 MG capsule TAKE 1 CAPSULE BY MOUTH DAILY (Patient taking differently: TAKE 1 CAPSULE (40mg ) BY MOUTH DAILY) 90 capsule 0  . omega-3 acid ethyl esters (LOVAZA) 1 G capsule Take 2 g by mouth daily.     Marland Kitchen. warfarin (COUMADIN) 5 MG tablet TAKE AS DIRECTED BY COUMADIN CLINIC 90 tablet 0   No current facility-administered medications for this visit.      Past Medical History:  Diagnosis Date  . Atrial fibrillation (HCC)   . Hypertension   . Nephrolithiasis   . Other and unspecified hyperlipidemia   . Sinoatrial node dysfunction (HCC)     ROS:   All systems reviewed and negative except as noted in the HPI.   Past Surgical History:  Procedure Laterality Date  . INSERT / REPLACE / REMOVE PACEMAKER     guidant 10-06  . PPM GENERATOR CHANGEOUT  N/A 10/15/2017   Procedure: PPM GENERATOR CHANGEOUT;  Surgeon: Marinus Mawaylor, Gregg W, MD;  [facility];  Service: Cardiovascular;  Laterality: N/A;     Family History  Problem Relation Age of Onset  . Heart attack Mother   . Hypertension Father      Social History   Socioeconomic History  . Marital status: Married    Spouse name: Not on file  . Number of children: Not on file  . Years of education: Not on file  . Highest education level: Not on file  Occupational History  . Occupation: retired Garment/textile technologistengineer    Employer: RETIRED  Social Needs  . Financial resource strain: Not on file  . Food insecurity    Worry: Not on file    Inability: Not on file  . Transportation needs    Medical: Not on file    Non-medical: Not on file  Tobacco Use  . Smoking status: Never Smoker  . Smokeless tobacco: Never Used  Substance and Sexual Activity  . Alcohol use: Yes    Comment: minimal  . Drug use: No  . Sexual activity: Not on file  Lifestyle  . Physical activity    Days per week: Not on file    Minutes per session: Not on file  . Stress: Not on file  Relationships  . Social connections    Talks  on phone: Not on file    Gets together: Not on file    Attends religious service: Not on file    Active member of club or organization: Not on file    Attends meetings of clubs or organizations: Not on file    Relationship status: Not on file  . Intimate partner violence    Fear of current or ex partner: Not on file    Emotionally abused: Not on file    Physically abused: Not on file    Forced sexual activity: Not on file  Other Topics Concern  . Not on file  Social History Narrative  . Not on file     BP 108/74   Pulse 71   Ht 5\' 10"  (1.778 m)   Wt 230 lb (104.3 kg)   SpO2 95%   BMI 33.00 kg/m   Physical Exam:  Well appearing NAD HEENT: Unremarkable Neck:  No JVD, no thyromegally Lymphatics:  No adenopathy Back:  No CVA tenderness Lungs:  Clear with no  wheezes HEART:  Regular rate rhythm, no murmurs, no rubs, no clicks Abd:  soft, positive bowel sounds, no organomegally, no rebound, no guarding Ext:  2 plus pulses, no edema, no cyanosis, no clubbing Skin:  No rashes no nodules Neuro:  CN II through XII intact, motor grossly intact  EKG - atrial fib with ventricular pacing  DEVICE  Normal device function.  See PaceArt for details.   Assess/Plan: 1. Coags - the patient and his wife would like for him to switch to Eliquis. We will prescribe 5 mg twice daily. He will likely need to switch to 2.5 bid when he reaches 80.  2. Chronic diastolic CHF - his symptoms remain class 2. He is euvolemic on exam today 3. Atrial fib - his rates are well controlled.  4. Chronic renal failure - he is class 2. He will have his labs checked today.  Mikle Bosworth.D.

## 2019-02-12 NOTE — Progress Notes (Signed)
Remote pacemaker transmission.   

## 2019-02-14 ENCOUNTER — Other Ambulatory Visit: Payer: Self-pay

## 2019-02-14 ENCOUNTER — Ambulatory Visit (INDEPENDENT_AMBULATORY_CARE_PROVIDER_SITE_OTHER): Payer: Medicare Other | Admitting: *Deleted

## 2019-02-14 DIAGNOSIS — I480 Paroxysmal atrial fibrillation: Secondary | ICD-10-CM

## 2019-02-14 DIAGNOSIS — Z5181 Encounter for therapeutic drug level monitoring: Secondary | ICD-10-CM

## 2019-02-14 LAB — POCT INR: INR: 1.4 — AB (ref 2.0–3.0)

## 2019-04-14 ENCOUNTER — Other Ambulatory Visit: Payer: Medicare Other

## 2019-04-15 ENCOUNTER — Other Ambulatory Visit: Payer: Medicare Other

## 2019-04-15 ENCOUNTER — Other Ambulatory Visit: Payer: Self-pay

## 2019-04-15 DIAGNOSIS — I10 Essential (primary) hypertension: Secondary | ICD-10-CM

## 2019-04-15 DIAGNOSIS — Z950 Presence of cardiac pacemaker: Secondary | ICD-10-CM

## 2019-04-15 DIAGNOSIS — I480 Paroxysmal atrial fibrillation: Secondary | ICD-10-CM

## 2019-04-15 DIAGNOSIS — I495 Sick sinus syndrome: Secondary | ICD-10-CM

## 2019-04-15 LAB — BASIC METABOLIC PANEL
BUN/Creatinine Ratio: 20 (ref 10–24)
BUN: 38 mg/dL — ABNORMAL HIGH (ref 8–27)
CO2: 26 mmol/L (ref 20–29)
Calcium: 9.5 mg/dL (ref 8.6–10.2)
Chloride: 103 mmol/L (ref 96–106)
Creatinine, Ser: 1.89 mg/dL — ABNORMAL HIGH (ref 0.76–1.27)
GFR calc Af Amer: 38 mL/min/{1.73_m2} — ABNORMAL LOW (ref >59)
GFR calc non Af Amer: 33 mL/min/{1.73_m2} — ABNORMAL LOW (ref >59)
Glucose: 136 mg/dL — ABNORMAL HIGH (ref 65–99)
Potassium: 3.4 mmol/L — ABNORMAL LOW (ref 3.5–5.2)
Sodium: 137 mmol/L (ref 134–144)

## 2019-04-15 LAB — CBC WITH DIFFERENTIAL/PLATELET
Basophils Absolute: 0 10*3/uL (ref 0.0–0.2)
Basos: 0 %
EOS (ABSOLUTE): 0.1 10*3/uL (ref 0.0–0.4)
Eos: 1 %
Hematocrit: 45.9 % (ref 37.5–51.0)
Hemoglobin: 15.8 g/dL (ref 13.0–17.7)
Lymphocytes Absolute: 2.5 10*3/uL (ref 0.7–3.1)
Lymphs: 27 %
MCH: 31.1 pg (ref 26.6–33.0)
MCHC: 34.4 g/dL (ref 31.5–35.7)
MCV: 90 fL (ref 79–97)
Monocytes Absolute: 0.9 10*3/uL (ref 0.1–0.9)
Monocytes: 9 %
Neutrophils Absolute: 5.8 10*3/uL (ref 1.4–7.0)
Neutrophils: 63 %
Platelets: 303 10*3/uL (ref 150–450)
RBC: 5.08 x10E6/uL (ref 4.14–5.80)
RDW: 14.2 % (ref 11.6–15.4)
WBC: 9.2 10*3/uL (ref 3.4–10.8)

## 2019-04-17 ENCOUNTER — Telehealth: Payer: Self-pay | Admitting: Internal Medicine

## 2019-04-17 NOTE — Telephone Encounter (Signed)
Wife of patient wanted to know if the results from the patient's labs on 04-15-19 were ready. Please call to discuss

## 2019-04-17 NOTE — Telephone Encounter (Signed)
Wife also requested that a copy of the results be sent to the patient's PCP Alexi Radiontchenko. You can fax those results to  [phone]

## 2019-04-18 NOTE — Telephone Encounter (Signed)
Forwarded labs to PCP as requested.  Unsure how to release to MyChart, labs were not forwarded to this nurse.    Will mail labs on Monday.

## 2019-05-12 ENCOUNTER — Ambulatory Visit (INDEPENDENT_AMBULATORY_CARE_PROVIDER_SITE_OTHER): Payer: Medicare Other | Admitting: *Deleted

## 2019-05-12 DIAGNOSIS — I495 Sick sinus syndrome: Secondary | ICD-10-CM | POA: Diagnosis not present

## 2019-05-13 LAB — CUP PACEART REMOTE DEVICE CHECK
Battery Remaining Longevity: 132 mo
Battery Remaining Percentage: 100 %
Brady Statistic RA Percent Paced: 4 %
Brady Statistic RV Percent Paced: 55 %
Date Time Interrogation Session: 20201102094000
Implantable Lead Implant Date: 20061026
Implantable Lead Implant Date: 20061026
Implantable Lead Location: 753859
Implantable Lead Location: 753860
Implantable Lead Model: 4053
Implantable Lead Model: 4054
Implantable Lead Serial Number: 416839
Implantable Lead Serial Number: 453015
Implantable Pulse Generator Implant Date: 20190408
Lead Channel Impedance Value: 622 Ohm
Lead Channel Impedance Value: 645 Ohm
Lead Channel Pacing Threshold Amplitude: 1.1 V
Lead Channel Pacing Threshold Pulse Width: 0.4 ms
Lead Channel Setting Pacing Amplitude: 3 V
Lead Channel Setting Pacing Amplitude: 3 V
Lead Channel Setting Pacing Pulse Width: 0.6 ms
Lead Channel Setting Sensing Sensitivity: 3 mV
Pulse Gen Serial Number: 822581

## 2019-06-03 NOTE — Progress Notes (Signed)
Remote pacemaker transmission.   

## 2019-08-11 ENCOUNTER — Ambulatory Visit (INDEPENDENT_AMBULATORY_CARE_PROVIDER_SITE_OTHER): Payer: Medicare Other | Admitting: *Deleted

## 2019-08-11 DIAGNOSIS — I495 Sick sinus syndrome: Secondary | ICD-10-CM

## 2019-08-11 LAB — CUP PACEART REMOTE DEVICE CHECK
Battery Remaining Longevity: 132 mo
Battery Remaining Percentage: 100 %
Brady Statistic RA Percent Paced: 20 %
Brady Statistic RV Percent Paced: 40 %
Date Time Interrogation Session: 20210201044100
Implantable Lead Implant Date: 20061026
Implantable Lead Implant Date: 20061026
Implantable Lead Location: 753859
Implantable Lead Location: 753860
Implantable Lead Model: 4053
Implantable Lead Model: 4054
Implantable Lead Serial Number: 416839
Implantable Lead Serial Number: 453015
Implantable Pulse Generator Implant Date: 20190408
Lead Channel Impedance Value: 606 Ohm
Lead Channel Impedance Value: 645 Ohm
Lead Channel Pacing Threshold Amplitude: 1.2 V
Lead Channel Pacing Threshold Pulse Width: 0.4 ms
Lead Channel Setting Pacing Amplitude: 3 V
Lead Channel Setting Pacing Amplitude: 3 V
Lead Channel Setting Pacing Pulse Width: 0.6 ms
Lead Channel Setting Sensing Sensitivity: 3 mV
Pulse Gen Serial Number: 822581

## 2019-08-11 NOTE — Progress Notes (Signed)
PPM Remote  

## 2019-09-29 ENCOUNTER — Encounter: Payer: Self-pay | Admitting: Internal Medicine

## 2019-09-29 ENCOUNTER — Ambulatory Visit (INDEPENDENT_AMBULATORY_CARE_PROVIDER_SITE_OTHER): Payer: Medicare Other | Admitting: Internal Medicine

## 2019-09-29 ENCOUNTER — Other Ambulatory Visit: Payer: Self-pay

## 2019-09-29 VITALS — BP 130/80 | HR 61 | Resp 15 | Ht 70.0 in | Wt 238.0 lb

## 2019-09-29 DIAGNOSIS — Z950 Presence of cardiac pacemaker: Secondary | ICD-10-CM | POA: Diagnosis not present

## 2019-09-29 DIAGNOSIS — I495 Sick sinus syndrome: Secondary | ICD-10-CM

## 2019-09-29 DIAGNOSIS — I480 Paroxysmal atrial fibrillation: Secondary | ICD-10-CM

## 2019-09-29 NOTE — Progress Notes (Signed)
HPI [name] returns today for followup of PAF, CHB, s/p PPM insertion. He has developed some cough and dyspnea and his TSH was elevated when checked a few weeks ago. The patient denies fever or chills. No peripheral edema. His wife thinks that he is more tired. Allergies  Allergen Reactions  . Diphenhydramine Hcl Rash  . Pheniramine-Pe-Apap Rash  . Phenylephrine-Pheniramine-Dm Rash  . Tamiflu [Oseltamivir] Itching and Rash     Current Outpatient Medications  Medication Sig Dispense Refill  . amLODipine-benazepril (LOTREL) 5-40 MG capsule Take 1 capsule by mouth 2 (two) times daily.    Marland Kitchen apixaban (ELIQUIS) 5 MG TABS tablet Take 1 tablet (5 mg total) by mouth 2 (two) times daily. 60 tablet 11  . CRESTOR 20 MG tablet TAKE 1 TABLET BY MOUTH DAILY (Patient taking differently: TAKE 1 TABLET (20mg ) BY MOUTH DAILY) 90 tablet 0  . hydrochlorothiazide (HYDRODIURIL) 25 MG tablet TAKE 1 TABLET BY MOUTH EVERY DAY (Patient taking differently: TAKE 1 TABLET (25mg ) BY MOUTH EVERY DAY) 90 tablet 0  . metoprolol (TOPROL-XL) 200 MG 24 hr tablet TAKE 1 TABLET BY MOUTH EVERY DAY (Patient taking differently: TAKE 1 TABLET (200mg ) BY MOUTH EVERY DAY) 90 tablet 0  . NEXIUM 40 MG capsule TAKE 1 CAPSULE BY MOUTH DAILY (Patient taking differently: TAKE 1 CAPSULE (40mg ) BY MOUTH DAILY) 90 capsule 0  . omega-3 acid ethyl esters (LOVAZA) 1 G capsule Take 2 g by mouth daily.      No current facility-administered medications for this visit.     Past Medical History:  Diagnosis Date  . Atrial fibrillation (HCC)   . Hypertension   . Nephrolithiasis   . Other and unspecified hyperlipidemia   . Sinoatrial node dysfunction (HCC)     ROS:   All systems reviewed and negative except as noted in the HPI.   Past Surgical History:  Procedure Laterality Date  . INSERT / REPLACE / REMOVE PACEMAKER     guidant 10-06  . PPM GENERATOR CHANGEOUT N/A 10/15/2017   Procedure: PPM GENERATOR CHANGEOUT;  Surgeon:  , MD;  Location: [facility] INVASIVE CV LAB;  Service: Cardiovascular;  Laterality: N/A;     Family History  Problem Relation Age of Onset  . Heart attack Mother   . Hypertension Father      Social History   Socioeconomic History  . Marital status: Married    Spouse name: Not on file  . Number of children: Not on file  . Years of education: Not on file  . Highest education level: Not on file  Occupational History  . Occupation: retired 12-06: RETIRED  Tobacco Use  . Smoking status: Never Smoker  . Smokeless tobacco: Never Used  Substance and Sexual Activity  . Alcohol use: Yes    Comment: minimal  . Drug use: No  . Sexual activity: Not on file  Other Topics Concern  . Not on file  Social History Narrative  . Not on file   Social Determinants of Health   Financial Resource Strain:   . Difficulty of Paying Living Expenses:   Food Insecurity:   . Worried About 12/15/2017 in the Last Year:   . Marinus Maw in the Last Year:   Transportation Needs:   . [facility] (Medical):   Garment/textile technologist Lack of Transportation (Non-Medical):   Physical Activity:   . Days of Exercise per Week:   . Minutes of  Exercise per Session:   Stress:   . Feeling of Stress :   Social Connections:   . Frequency of Communication with Friends and Family:   . Frequency of Social Gatherings with Friends and Family:   . Attends Religious Services:   . Active Member of Clubs or Organizations:   . Attends Archivist Meetings:   Marland Kitchen Marital Status:   Intimate Partner Violence:   . Fear of Current or Ex-Partner:   . Emotionally Abused:   Marland Kitchen Physically Abused:   . Sexually Abused:      BP 130/80   Pulse 61   Resp 15   Ht 5\' 10"  (1.778 m)   Wt 238 lb (108 kg)   SpO2 94%   BMI 34.15 kg/m   Physical Exam:  Well appearing NAD HEENT: Unremarkable Neck:  No JVD, no thyromegally Lymphatics:  No adenopathy Back:  No CVA tenderness Lungs:  Clear  with no wheezes HEART:  Regular rate rhythm, no murmurs, no rubs, no clicks Abd:  soft, positive bowel sounds, no organomegally, no rebound, no guarding Ext:  2 plus pulses, no edema, no cyanosis, no clubbing Skin:  No rashes no nodules Neuro:  CN II through XII intact, motor grossly intact  EKG - NSR   DEVICE  Normal device function.  See PaceArt for details.   Assess/Plan: 1. PAF - he is maintaining NSR on amiodarone. I plan to stop this medication. See below. 2. Cough - I am concerned that his cough might be due to lung toxicity. He will stop the amiodarone. 3. Hypothyroidism - his TSH is high. I held off on starting synthroid. He will stop the amio and we will recheck the TSH in 3 months. 4. PPM - his [employer] DDD PM is working normally. We will follow.  Mikle Bosworth.D.

## 2019-09-29 NOTE — Patient Instructions (Addendum)
Medication Instructions:  Your physician has recommended you make the following change in your medication:   STOP taking amiodarone  If you feel bad before you appointment with Dr. Chesley Noon RN [phone] and we will get you in to see Mardelle Matte or Renee sooner.   Labwork: None ordered.  Testing/Procedures: None ordered.  Follow-Up: Your physician wants you to follow-up in: 3 months with Dr. Ladona Ridgel.     December 23, 2019 at 2:30 pm   Remote monitoring is used to monitor your Pacemaker from home. This monitoring reduces the number of office visits required to check your device to one time per year. It allows [place] to keep an eye on the functioning of your device to ensure it is working properly. You are scheduled for a device check from home on 11/10/2019. You may send your transmission at any time that day. If you have a wireless device, the transmission will be sent automatically. After your physician reviews your transmission, you will receive a postcard with your next transmission date.  Any Other Special Instructions Will Be Listed Below (If Applicable).  If you need a refill on your cardiac medications before your next appointment, please call your pharmacy.

## 2019-11-10 ENCOUNTER — Ambulatory Visit (INDEPENDENT_AMBULATORY_CARE_PROVIDER_SITE_OTHER): Payer: Medicare Other | Admitting: *Deleted

## 2019-11-10 DIAGNOSIS — I480 Paroxysmal atrial fibrillation: Secondary | ICD-10-CM

## 2019-11-10 LAB — CUP PACEART REMOTE DEVICE CHECK
Battery Remaining Longevity: 126 mo
Battery Remaining Percentage: 100 %
Brady Statistic RA Percent Paced: 55 %
Brady Statistic RV Percent Paced: 27 %
Date Time Interrogation Session: 20210503044100
Implantable Lead Implant Date: 20061026
Implantable Lead Implant Date: 20061026
Implantable Lead Location: 753859
Implantable Lead Location: 753860
Implantable Lead Model: 4053
Implantable Lead Model: 4054
Implantable Lead Serial Number: 416839
Implantable Lead Serial Number: 453015
Implantable Pulse Generator Implant Date: 20190408
Lead Channel Impedance Value: 586 Ohm
Lead Channel Impedance Value: 625 Ohm
Lead Channel Pacing Threshold Amplitude: 1.7 V
Lead Channel Pacing Threshold Pulse Width: 0.4 ms
Lead Channel Setting Pacing Amplitude: 3 V
Lead Channel Setting Pacing Amplitude: 3 V
Lead Channel Setting Pacing Pulse Width: 0.6 ms
Lead Channel Setting Sensing Sensitivity: 3 mV
Pulse Gen Serial Number: 822581

## 2019-11-10 NOTE — Progress Notes (Signed)
Remote pacemaker transmission.   

## 2019-12-23 ENCOUNTER — Ambulatory Visit (INDEPENDENT_AMBULATORY_CARE_PROVIDER_SITE_OTHER): Payer: Medicare Other | Admitting: Internal Medicine

## 2019-12-23 ENCOUNTER — Other Ambulatory Visit: Payer: Self-pay

## 2019-12-23 ENCOUNTER — Encounter: Payer: Self-pay | Admitting: Internal Medicine

## 2019-12-23 VITALS — BP 138/80 | HR 61 | Ht 70.0 in | Wt 238.0 lb

## 2019-12-23 DIAGNOSIS — Z950 Presence of cardiac pacemaker: Secondary | ICD-10-CM | POA: Diagnosis not present

## 2019-12-23 DIAGNOSIS — I480 Paroxysmal atrial fibrillation: Secondary | ICD-10-CM

## 2019-12-23 DIAGNOSIS — I495 Sick sinus syndrome: Secondary | ICD-10-CM

## 2019-12-23 MED ORDER — LOSARTAN POTASSIUM 25 MG PO TABS
25.0000 mg | ORAL_TABLET | Freq: Every day | ORAL | 3 refills | Status: AC
Start: 2019-12-23 — End: 2024-01-24

## 2019-12-23 NOTE — Patient Instructions (Addendum)
Medication Instructions:  Your physician has recommended you make the following change in your medication:   1.  Start taking losartan 25 mg-  Take one tablet by mouth daily  2.  STOP taking benazepril  Labwork: You will get lab work today:  TSH  Testing/Procedures: None ordered.  Follow-Up: Your physician wants you to follow-up in: 3 months with Dr. Ladona Ridgel.     Remote monitoring is used to monitor your Pacemaker from home. This monitoring reduces the number of office visits required to check your device to one time per year. It allows [place] to keep an eye on the functioning of your device to ensure it is working properly. You are scheduled for a device check from home on 02/09/2020. You may send your transmission at any time that day. If you have a wireless device, the transmission will be sent automatically. After your physician reviews your transmission, you will receive a postcard with your next transmission date.  Any Other Special Instructions Will Be Listed Below (If Applicable).  If you need a refill on your cardiac medications before your next appointment, please call your pharmacy.

## 2019-12-23 NOTE — Progress Notes (Signed)
HPI Jacob Skinner returns today for followup. 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 He also had an elevated TSH and has been started on synthroid. He has been coughing. He takes benazapril. He has not had chest pain and is exercising in the pool. He denies edema.  Allergies  Allergen Reactions  . Diphenhydramine Hcl Rash  . Pheniramine-Pe-Apap Rash  . Phenylephrine-Pheniramine-Dm Rash  . Tamiflu [Oseltamivir] Itching and Rash     Current Outpatient Medications  Medication Sig Dispense Refill  . amLODipine (NORVASC) 5 MG tablet Take 1 tablet by mouth daily.    Marland Kitchen apixaban (ELIQUIS) 5 MG TABS tablet Take 1 tablet (5 mg total) by mouth 2 (two) times daily. 60 tablet 11  . benazepril (LOTENSIN) 40 MG tablet Take 1 tablet by mouth daily.    . CRESTOR 20 MG tablet TAKE 1 TABLET BY MOUTH DAILY 90 tablet 0  . hydrochlorothiazide (HYDRODIURIL) 25 MG tablet TAKE 1 TABLET BY MOUTH EVERY DAY 90 tablet 0  . levothyroxine (SYNTHROID) 50 MCG tablet Take 1 tablet by mouth daily.    . metoprolol (TOPROL-XL) 200 MG 24 hr tablet TAKE 1 TABLET BY MOUTH EVERY DAY 90 tablet 0  . NEXIUM 40 MG capsule TAKE 1 CAPSULE BY MOUTH DAILY 90 capsule 0  . omega-3 acid ethyl esters (LOVAZA) 1 G capsule Take 2 g by mouth daily.      No current facility-administered medications for this visit.     Past Medical History:  Diagnosis Date  . Atrial fibrillation (HCC)   . Hypertension   . Nephrolithiasis   . Other and unspecified hyperlipidemia   . Sinoatrial node dysfunction (HCC)     ROS:   All systems reviewed and negative except as noted in the HPI.   Past Surgical History:  Procedure Laterality Date  . INSERT / REPLACE / REMOVE PACEMAKER     guidant 10-06  . PPM GENERATOR CHANGEOUT N/A 10/15/2017   Procedure: PPM GENERATOR CHANGEOUT;  Surgeon: Marinus Maw, MD;  Location: [facility] INVASIVE CV LAB;  Service: Cardiovascular;  Laterality: N/A;     Family History  Problem  Relation Age of Onset  . Heart attack Mother   . Hypertension Father      Social History   Socioeconomic History  . Marital status: Married    Spouse name: Not on file  . Number of children: Not on file  . Years of education: Not on file  . Highest education level: Not on file  Occupational History  . Occupation: retired Garment/textile technologist: RETIRED  Tobacco Use  . Smoking status: Never Smoker  . Smokeless tobacco: Never Used  Vaping Use  . Vaping Use: Never used  Substance and Sexual Activity  . Alcohol use: Yes    Comment: minimal  . Drug use: No  . Sexual activity: Not on file  Other Topics Concern  . Not on file  Social History Narrative  . Not on file   Social Determinants of Health   Financial Resource Strain:   . Difficulty of Paying Living Expenses:   Food Insecurity:   . Worried About Programme researcher, broadcasting/film/video in the Last Year:   . Barista in the Last Year:   Transportation Needs:   . Freight forwarder (Medical):   Marland Kitchen Lack of Transportation (Non-Medical):   Physical Activity:   . Days of Exercise per Week:   . Minutes of Exercise per  Session:   Stress:   . Feeling of Stress :   Social Connections:   . Frequency of Communication with Friends and Family:   . Frequency of Social Gatherings with Friends and Family:   . Attends Religious Services:   . Active Member of Clubs or Organizations:   . Attends Archivist Meetings:   Marland Kitchen Marital Status:   Intimate Partner Violence:   . Fear of Current or Ex-Partner:   . Emotionally Abused:   Marland Kitchen Physically Abused:   . Sexually Abused:      BP 138/80   Pulse 61   Ht 5\' 10"  (1.778 m)   Wt 238 lb (108 kg)   SpO2 95%   BMI 34.15 kg/m   Physical Exam:  Well appearing NAD HEENT: Unremarkable Neck:  No JVD, no thyromegally Lymphatics:  No adenopathy Back:  No CVA tenderness Lungs:  Clear with no wheezes HEART:  Regular rate rhythm, no murmurs, no rubs, no clicks Abd:  soft, positive  bowel sounds, no organomegally, no rebound, no guarding Ext:  2 plus pulses, no edema, no cyanosis, no clubbing Skin:  No rashes no nodules Neuro:  CN II through XII intact, motor grossly intact   DEVICE  Normal device function.  See PaceArt for details.   Assess/Plan: 1. Persistent atrial fib - despite stopping his amiodarone, he is back in NSR. No change in his meds for this. 2. Hypothyroid - he is on synthroid replacement and we will check his TSH.  3. Cough - I suspect that this is due to benazapril which he will stop. He will start losartan for his bp instead. 4. Coags - he is tolerating eliquis with no bleeding.  Mikle Bosworth.D.

## 2019-12-24 LAB — TSH: TSH: 2.86 u[IU]/mL (ref 0.450–4.500)

## 2020-01-14 LAB — CUP PACEART INCLINIC DEVICE CHECK
Date Time Interrogation Session: 20210615170303
Implantable Lead Implant Date: 20061026
Implantable Lead Implant Date: 20061026
Implantable Lead Location: 753859
Implantable Lead Location: 753860
Implantable Lead Model: 4053
Implantable Lead Model: 4054
Implantable Lead Serial Number: 416839
Implantable Lead Serial Number: 453015
Implantable Pulse Generator Implant Date: 20190408
Lead Channel Impedance Value: 615 Ohm
Lead Channel Impedance Value: 615 Ohm
Lead Channel Impedance Value: 651 Ohm
Lead Channel Impedance Value: 651 Ohm
Lead Channel Pacing Threshold Amplitude: 1.6 V
Lead Channel Pacing Threshold Amplitude: 1.6 V
Lead Channel Pacing Threshold Amplitude: 1.7 V
Lead Channel Pacing Threshold Amplitude: 1.7 V
Lead Channel Pacing Threshold Pulse Width: 0.4 ms
Lead Channel Pacing Threshold Pulse Width: 0.4 ms
Lead Channel Pacing Threshold Pulse Width: 0.6 ms
Lead Channel Pacing Threshold Pulse Width: 0.6 ms
Lead Channel Sensing Intrinsic Amplitude: 15.3 mV
Lead Channel Sensing Intrinsic Amplitude: 15.3 mV
Lead Channel Sensing Intrinsic Amplitude: 2.2 mV
Lead Channel Sensing Intrinsic Amplitude: 2.2 mV
Lead Channel Setting Pacing Amplitude: 3 V
Lead Channel Setting Pacing Amplitude: 3 V
Lead Channel Setting Pacing Pulse Width: 0.4 ms
Lead Channel Setting Sensing Sensitivity: 3 mV
Pulse Gen Serial Number: 822581

## 2020-02-09 ENCOUNTER — Ambulatory Visit (INDEPENDENT_AMBULATORY_CARE_PROVIDER_SITE_OTHER): Payer: Medicare Other | Admitting: *Deleted

## 2020-02-09 DIAGNOSIS — I480 Paroxysmal atrial fibrillation: Secondary | ICD-10-CM

## 2020-02-09 LAB — CUP PACEART INCLINIC DEVICE CHECK
Date Time Interrogation Session: 20210322083606
Implantable Lead Implant Date: 20061026
Implantable Lead Implant Date: 20061026
Implantable Lead Location: 753859
Implantable Lead Location: 753860
Implantable Lead Model: 4053
Implantable Lead Model: 4054
Implantable Lead Serial Number: 416839
Implantable Lead Serial Number: 453015
Implantable Pulse Generator Implant Date: 20190408
Pulse Gen Serial Number: 822581

## 2020-02-10 LAB — CUP PACEART REMOTE DEVICE CHECK
Battery Remaining Longevity: 114 mo
Battery Remaining Percentage: 100 %
Brady Statistic RA Percent Paced: 35 %
Brady Statistic RV Percent Paced: 24 %
Date Time Interrogation Session: 20210803105400
Implantable Lead Implant Date: 20061026
Implantable Lead Implant Date: 20061026
Implantable Lead Location: 753859
Implantable Lead Location: 753860
Implantable Lead Model: 4053
Implantable Lead Model: 4054
Implantable Lead Serial Number: 416839
Implantable Lead Serial Number: 453015
Implantable Pulse Generator Implant Date: 20190408
Lead Channel Impedance Value: 603 Ohm
Lead Channel Impedance Value: 636 Ohm
Lead Channel Pacing Threshold Amplitude: 1.3 V
Lead Channel Pacing Threshold Amplitude: 1.9 V
Lead Channel Pacing Threshold Pulse Width: 0.4 ms
Lead Channel Pacing Threshold Pulse Width: 0.4 ms
Lead Channel Setting Pacing Amplitude: 3 V
Lead Channel Setting Pacing Amplitude: 3 V
Lead Channel Setting Pacing Pulse Width: 0.4 ms
Lead Channel Setting Sensing Sensitivity: 3 mV
Pulse Gen Serial Number: 822581

## 2020-02-11 NOTE — Progress Notes (Signed)
Remote pacemaker transmission.   

## 2020-03-25 ENCOUNTER — Encounter: Payer: Medicare Other | Admitting: Internal Medicine

## 2020-04-02 ENCOUNTER — Ambulatory Visit (INDEPENDENT_AMBULATORY_CARE_PROVIDER_SITE_OTHER): Payer: Medicare Other | Admitting: Physician Assistant

## 2020-04-02 ENCOUNTER — Other Ambulatory Visit: Payer: Self-pay

## 2020-04-02 VITALS — BP 110/68 | HR 61 | Ht 70.0 in | Wt 238.0 lb

## 2020-04-02 DIAGNOSIS — I10 Essential (primary) hypertension: Secondary | ICD-10-CM

## 2020-04-02 DIAGNOSIS — Z79899 Other long term (current) drug therapy: Secondary | ICD-10-CM

## 2020-04-02 DIAGNOSIS — I4819 Other persistent atrial fibrillation: Secondary | ICD-10-CM

## 2020-04-02 DIAGNOSIS — Z950 Presence of cardiac pacemaker: Secondary | ICD-10-CM

## 2020-04-02 LAB — BASIC METABOLIC PANEL
BUN/Creatinine Ratio: 15 (ref 10–24)
BUN: 22 mg/dL (ref 8–27)
CO2: 27 mmol/L (ref 20–29)
Calcium: 9.9 mg/dL (ref 8.6–10.2)
Chloride: 102 mmol/L (ref 96–106)
Creatinine, Ser: 1.47 mg/dL — ABNORMAL HIGH (ref 0.76–1.27)
GFR calc Af Amer: 52 mL/min/{1.73_m2} — ABNORMAL LOW (ref >59)
GFR calc non Af Amer: 45 mL/min/{1.73_m2} — ABNORMAL LOW (ref >59)
Glucose: 137 mg/dL — ABNORMAL HIGH (ref 65–99)
Potassium: 3.7 mmol/L (ref 3.5–5.2)
Sodium: 142 mmol/L (ref 134–144)

## 2020-04-02 LAB — CBC
Hematocrit: 46.1 % (ref 37.5–51.0)
Hemoglobin: 15.7 g/dL (ref 13.0–17.7)
MCH: 32.8 pg (ref 26.6–33.0)
MCHC: 34.1 g/dL (ref 31.5–35.7)
MCV: 96 fL (ref 79–97)
Platelets: 294 10*3/uL (ref 150–450)
RBC: 4.78 x10E6/uL (ref 4.14–5.80)
RDW: 12.4 % (ref 11.6–15.4)
WBC: 8.8 10*3/uL (ref 3.4–10.8)

## 2020-04-02 NOTE — Addendum Note (Signed)
Addended by: Oleta Mouse on: 04/02/2020 01:34 PM   Modules accepted: Orders

## 2020-04-02 NOTE — Patient Instructions (Addendum)
Medication Instructions:   Your physician recommends that you continue on your current medications as directed. Please refer to the Current Medication list given to you today.  *If you need a refill on your cardiac medications before your next appointment, please call your pharmacy*   Lab Work:  BMET AND  CBC TODAY   If you have labs (blood work) drawn today and your tests are completely normal, you will receive your results only by: Marland Kitchen MyChart Message (if you have MyChart) OR . A paper copy in the mail If you have any lab test that is abnormal or we need to change your treatment, we will call you to review the results.   Testing/Procedures: NONE ORDERED  TODAY    Follow-Up: At [facility], you and your health needs are our priority.  As part of our continuing mission to provide you with exceptional heart care, we have created designated Provider Care Teams.  These Care Teams include your primary Cardiologist (physician) and Advanced Practice Providers (APPs -  Physician Assistants and Nurse Practitioners) who all work together to provide you with the care you need, when you need it.  We recommend signing up for the patient portal called "MyChart".  Sign up information is provided on this After Visit Summary.  MyChart is used to connect with patients for Virtual Visits (Telemedicine).  Patients are able to view lab/test results, encounter notes, upcoming appointments, etc.  Non-urgent messages can be sent to your provider as well.   To learn more about what you can do with MyChart, go to [email or web address].    Your next appointment:   3 month(s)  The format for your next appointment:   In Person  Provider:   You may see Dr. Ladona Ridgel or one of the following Advanced Practice Providers on your designated Care Team:    Gypsy Balsam, NP  Francis Dowse, PA-C  Casimiro Needle "Otilio Saber, [place]    Other Instructions

## 2020-04-02 NOTE — Progress Notes (Signed)
Cardiology Office Note Date:  04/02/2020  Patient ID:  Jacob Skinner, DOB 14-Oct-1940, MRN [id number] PCP:  Verl Bangs, MD  Cardiologist/Electrophysiologist: Dr. Ladona Ridgel    Chief Complaint: 3 mo f/u   History of Present Illness: Jacob Skinner is a 78 y.o. male with history of remote WPW, advanced heart block w/PPM, HTN, CKD (II), DM w/ neuropathy, HLD   He comes in today to be seen for Dr. Ladona Ridgel, last seen by him in Jun 2021.  He maintaining SR off his amiodarone.  No changes were made. Notes that his amiodarone stopped 2/2 lung toxicity and abnormal TSH  TODAY He is accompanied by his wife. He speaks [place], seems to understand some [place], I offered formal translation though denied and his wife translates.  She is quite knowledgeable regarding him He feels well,  Denies any CP or palpitations, no cardiac awareness, no changes in exertional capacity Denies SOB. I n d/w them about the amiodarone, she seems to have observed that his terrible caough got better when his ACE was changed to ARB Is also off amiodarone. No dizzy spells, near syncope or syncope. She tells me he is very complaint with his medicines, never missed a dose, rarely even late taking his pills. No bleeding or signs of bleeding.  His wife mentions that his last thyroid test noted his levels back to normal   He mentions that when laying on his left side he will feel like his pacer moves superior a little, no pain, but concerned if it was loose.   Device information [employer] dual chamber PPM, implanted 2006, gen change 2019  AFib Hx Diagnosis goes as far back as his epic chart (2010)  AAD:  Amiodarone also goes back to 2010 > pulm toxicity , abnormal TSH > stopped early 2021   Past Medical History:  Diagnosis Date  . Atrial fibrillation (HCC)   . Hypertension   . Nephrolithiasis   . Other and unspecified hyperlipidemia   . Sinoatrial node dysfunction (HCC)     Past Surgical History:   Procedure Laterality Date  . INSERT / REPLACE / REMOVE PACEMAKER     guidant 10-06  . PPM GENERATOR CHANGEOUT N/A 10/15/2017   Procedure: PPM GENERATOR CHANGEOUT;  Surgeon: Marinus Maw, MD;  Location: [facility] INVASIVE CV LAB;  Service: Cardiovascular;  Laterality: N/A;    Current Outpatient Medications  Medication Sig Dispense Refill  . amLODipine (NORVASC) 5 MG tablet Take 1 tablet by mouth daily.    Marland Kitchen apixaban (ELIQUIS) 5 MG TABS tablet Take 1 tablet (5 mg total) by mouth 2 (two) times daily. 60 tablet 11  . CRESTOR 20 MG tablet TAKE 1 TABLET BY MOUTH DAILY 90 tablet 0  . hydrochlorothiazide (HYDRODIURIL) 25 MG tablet TAKE 1 TABLET BY MOUTH EVERY DAY 90 tablet 0  . levothyroxine (SYNTHROID) 50 MCG tablet Take 1 tablet by mouth daily.    Marland Kitchen losartan (COZAAR) 25 MG tablet Take 1 tablet (25 mg total) by mouth daily. 90 tablet 3  . metoprolol (TOPROL-XL) 200 MG 24 hr tablet TAKE 1 TABLET BY MOUTH EVERY DAY 90 tablet 0  . NEXIUM 40 MG capsule TAKE 1 CAPSULE BY MOUTH DAILY 90 capsule 0  . omega-3 acid ethyl esters (LOVAZA) 1 G capsule Take 2 g by mouth daily.      No current facility-administered medications for this visit.    Allergies:   Diphenhydramine hcl, Pheniramine-pe-apap, Phenylephrine-pheniramine-dm, and Tamiflu [oseltamivir]   Social History:  The patient  reports that  he has never smoked. He has never used smokeless tobacco. He reports current alcohol use. He reports that he does not use drugs.   Family History:  The patient's family history includes Heart attack in his mother; Hypertension in his father.  ROS:  Please see the history of present illness.    All other systems are reviewed and otherwise negative.   PHYSICAL EXAM:  VS:  There were no vitals taken for this visit. BMI: There is no height or weight on file to calculate BMI. Well nourished, well developed, in no acute distress HEENT: normocephalic, atraumatic Neck: no JVD, carotid bruits or masses Cardiac:  RRR; no  significant murmurs, no rubs, or gallops Lungs:  CTA b/l, no wheezing, rhonchi or rales Abd: soft, nontender MS: no deformity or atrophy Ext: no edema Skin: warm and dry, no rash Neuro:  No gross deficits appreciated Psych: euthymic mood, full affect  PPM site is stable, no tethering or discomfort   EKG:  Done today and reviewed by myself shows  AF, V paced  Device interrogation done today and reviewed by myself:  Battery and lead measurements are stable Unable to tell exactly when he went into AF, programmed DDIR Atrial histograms suggest PAF at least with pacing % D/w industry, his P waves are 0.4, suspected perhaps undersensing an A pacing % may not be accurate representation. Changed A sensitivity to 0.15 today  Recent Labs: 04/15/2019: BUN 38; Creatinine, Ser 1.89; Hemoglobin 15.8; Platelets 303; Potassium 3.4; Sodium 137 12/23/2019: TSH 2.860  No results found for requested labs within last 8760 hours.   CrCl cannot be calculated (Patient's most recent lab result is older than the maximum 21 days allowed.).   Wt Readings from Last 3 Encounters:  12/23/19 238 lb (108 kg)  09/29/19 238 lb (108 kg)  02/07/19 230 lb (104.3 kg)     Other studies reviewed: Additional studies/records reviewed today include: summarized above  ASSESSMENT AND PLAN:  1. Persistent Afib     CHA2DS2Vasc is 4, on eliquis, appropriately doesed     Uncertain % burden     He is in AFib, V paced, histograms look ok, no RVR     No symptoms or cardiac awareness     Off amiodarone 2/2 pulm issues and thyroid as well     Eliquis labs today  2. HTN     Looks good, no changes  3. PPM     Intact function     Programmed as above    Disposition: F/u with remotes Q 3 mo, though will have him back in a few months to revisit his AF burden/symptoms.  Current medicines are reviewed at length with the patient today.  The patient did not have any concerns regarding medicines.  Norma Fredrickson,  PA-C 04/02/2020 4:14 AM     [facility] [address] [place] [address] [phone] (office)  [phone] (fax)

## 2020-05-10 ENCOUNTER — Ambulatory Visit (INDEPENDENT_AMBULATORY_CARE_PROVIDER_SITE_OTHER): Payer: Medicare Other

## 2020-05-10 DIAGNOSIS — I4819 Other persistent atrial fibrillation: Secondary | ICD-10-CM | POA: Diagnosis not present

## 2020-05-12 LAB — CUP PACEART REMOTE DEVICE CHECK
Battery Remaining Longevity: 90 mo
Battery Remaining Percentage: 87 %
Brady Statistic RA Percent Paced: 0 %
Brady Statistic RV Percent Paced: 68 %
Date Time Interrogation Session: 20211102133000
Implantable Lead Implant Date: 20061026
Implantable Lead Implant Date: 20061026
Implantable Lead Location: 753859
Implantable Lead Location: 753860
Implantable Lead Model: 4053
Implantable Lead Model: 4054
Implantable Lead Serial Number: 416839
Implantable Lead Serial Number: 453015
Implantable Pulse Generator Implant Date: 20190408
Lead Channel Impedance Value: 592 Ohm
Lead Channel Impedance Value: 623 Ohm
Lead Channel Pacing Threshold Amplitude: 1.9 V
Lead Channel Pacing Threshold Pulse Width: 0.4 ms
Lead Channel Setting Pacing Amplitude: 3 V
Lead Channel Setting Pacing Amplitude: 3 V
Lead Channel Setting Pacing Pulse Width: 0.4 ms
Lead Channel Setting Sensing Sensitivity: 3 mV
Pulse Gen Serial Number: 822581

## 2020-05-13 NOTE — Progress Notes (Signed)
Remote pacemaker transmission.   

## 2020-07-06 ENCOUNTER — Encounter: Payer: Self-pay | Admitting: Internal Medicine

## 2020-07-06 ENCOUNTER — Ambulatory Visit (INDEPENDENT_AMBULATORY_CARE_PROVIDER_SITE_OTHER): Payer: Medicare Other | Admitting: Internal Medicine

## 2020-07-06 ENCOUNTER — Other Ambulatory Visit: Payer: Self-pay

## 2020-07-06 VITALS — BP 122/76 | HR 69 | Ht 70.0 in | Wt 235.0 lb

## 2020-07-06 DIAGNOSIS — I480 Paroxysmal atrial fibrillation: Secondary | ICD-10-CM

## 2020-07-06 DIAGNOSIS — I495 Sick sinus syndrome: Secondary | ICD-10-CM

## 2020-07-06 DIAGNOSIS — Z950 Presence of cardiac pacemaker: Secondary | ICD-10-CM

## 2020-07-06 LAB — CUP PACEART INCLINIC DEVICE CHECK
Brady Statistic RA Percent Paced: 1 % — CL
Brady Statistic RV Percent Paced: 63 %
Date Time Interrogation Session: 20211228112455
Implantable Lead Implant Date: 20061026
Implantable Lead Implant Date: 20061026
Implantable Lead Location: 753859
Implantable Lead Location: 753860
Implantable Lead Model: 4053
Implantable Lead Model: 4054
Implantable Lead Serial Number: 416839
Implantable Lead Serial Number: 453015
Implantable Pulse Generator Implant Date: 20190408
Lead Channel Impedance Value: 631 Ohm
Lead Channel Impedance Value: 641 Ohm
Lead Channel Pacing Threshold Amplitude: 1.5 V
Lead Channel Pacing Threshold Amplitude: 1.6 V
Lead Channel Pacing Threshold Pulse Width: 0.4 ms
Lead Channel Pacing Threshold Pulse Width: 0.4 ms
Lead Channel Sensing Intrinsic Amplitude: 0.4 mV
Lead Channel Sensing Intrinsic Amplitude: 14.3 mV
Lead Channel Setting Pacing Amplitude: 3 V
Lead Channel Setting Pacing Pulse Width: 0.4 ms
Lead Channel Setting Sensing Sensitivity: 3 mV
Pulse Gen Serial Number: 822581

## 2020-07-06 MED ORDER — APIXABAN 5 MG PO TABS: 5.0000 mg | ORAL_TABLET | Freq: Two times a day (BID) | ORAL | 11 refills | Status: AC

## 2020-07-06 NOTE — Patient Instructions (Addendum)
Medication Instructions:  Your physician recommends that you continue on your current medications as directed. Please refer to the Current Medication list given to you today.  Labwork: None ordered.  Testing/Procedures: None ordered.  Follow-Up: Your physician wants you to follow-up in: 6 months with Dr. Ladona Ridgel.   You will receive a reminder letter in the mail two months in advance. If you don't receive a letter, please call our office to schedule the follow-up appointment.  Remote monitoring is used to monitor your Pacemaker from home. This monitoring reduces the number of office visits required to check your device to one time per year. It allows [place] to keep an eye on the functioning of your device to ensure it is working properly. You are scheduled for a device check from home on 08/09/2020. You may send your transmission at any time that day. If you have a wireless device, the transmission will be sent automatically. After your physician reviews your transmission, you will receive a postcard with your next transmission date.  Any Other Special Instructions Will Be Listed Below (If Applicable).  If you need a refill on your cardiac medications before your next appointment, please call your pharmacy.

## 2020-07-06 NOTE — Progress Notes (Signed)
HPI Mr. Tabb returns today for followup. 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 He also had an elevated TSH and has been started on synthroid. He has stopped coughing. He takes losartan. He has not had chest pain and is exercising in the pool. He denies edema.  Allergies  Allergen Reactions  . Ace Inhibitors Cough  . Diphenhydramine Hcl Rash  . Pheniramine-Pe-Apap Rash  . Phenylephrine-Pheniramine-Dm Rash  . Tamiflu [Oseltamivir] Itching and Rash     Current Outpatient Medications  Medication Sig Dispense Refill  . CRESTOR 20 MG tablet TAKE 1 TABLET BY MOUTH DAILY 90 tablet 0  . famotidine (PEPCID) 20 MG tablet Take 1 tablet by mouth in the morning and at bedtime.    . hydrochlorothiazide (HYDRODIURIL) 25 MG tablet TAKE 1 TABLET BY MOUTH EVERY DAY 90 tablet 0  . icosapent Ethyl (VASCEPA) 1 g capsule Take 1 capsule by mouth in the morning and at bedtime.    Marland Kitchen levothyroxine (SYNTHROID) 50 MCG tablet Take 1 tablet by mouth daily.    . metoprolol (TOPROL-XL) 200 MG 24 hr tablet TAKE 1 TABLET BY MOUTH EVERY DAY 90 tablet 0  . omega-3 acid ethyl esters (LOVAZA) 1 G capsule Take 2 g by mouth daily.     Marland Kitchen apixaban (ELIQUIS) 5 MG TABS tablet Take 1 tablet (5 mg total) by mouth 2 (two) times daily. 60 tablet 11  . losartan (COZAAR) 25 MG tablet Take 1 tablet (25 mg total) by mouth daily. 90 tablet 3   No current facility-administered medications for this visit.     Past Medical History:  Diagnosis Date  . Atrial fibrillation (HCC)   . Hypertension   . Nephrolithiasis   . Other and unspecified hyperlipidemia   . Sinoatrial node dysfunction (HCC)     ROS:   All systems reviewed and negative except as noted in the HPI.   Past Surgical History:  Procedure Laterality Date  . INSERT / REPLACE / REMOVE PACEMAKER     guidant 10-06  . PPM GENERATOR CHANGEOUT N/A 10/15/2017   Procedure: PPM GENERATOR CHANGEOUT;  Surgeon: Marinus Maw, MD;  Location: [facility]  INVASIVE CV LAB;  Service: Cardiovascular;  Laterality: N/A;     Family History  Problem Relation Age of Onset  . Heart attack Mother   . Hypertension Father      Social History   Socioeconomic History  . Marital status: Married    Spouse name: Not on file  . Number of children: Not on file  . Years of education: Not on file  . Highest education level: Not on file  Occupational History  . Occupation: retired Garment/textile technologist: RETIRED  Tobacco Use  . Smoking status: Never Smoker  . Smokeless tobacco: Never Used  Vaping Use  . Vaping Use: Never used  Substance and Sexual Activity  . Alcohol use: Yes    Comment: minimal  . Drug use: No  . Sexual activity: Not on file  Other Topics Concern  . Not on file  Social History Narrative  . Not on file   Social Determinants of Health   Financial Resource Strain: Not on file  Food Insecurity: Not on file  Transportation Needs: Not on file  Physical Activity: Not on file  Stress: Not on file  Social Connections: Not on file  Intimate Partner Violence: Not on file     BP 122/76   Pulse 69   Ht 5'  10" (1.778 m)   Wt 235 lb (106.6 kg)   SpO2 95%   BMI 33.72 kg/m   Physical Exam:  Well appearing NAD HEENT: Unremarkable Neck:  No JVD, no thyromegally Lymphatics:  No adenopathy Back:  No CVA tenderness Lungs:  Clear with no wheezes HEART:  Regular rate rhythm, no murmurs, no rubs, no clicks Abd:  soft, positive bowel sounds, no organomegally, no rebound, no guarding Ext:  2 plus pulses, no edema, no cyanosis, no clubbing Skin:  No rashes no nodules Neuro:  CN II through XII intact, motor grossly intact  EKG - atrial fib with a controlled VR  DEVICE  Normal device function.  See PaceArt for details.   Assess/Plan: 1. Persistent atrial fib - since stopping his amiodarone, he is back in atrial fib. No change in his meds for this. His rates are controlled. 2. Hypothyroid - he is on synthroid replacement and  we will check his TSH.  3. Cough - resolved with stopping his ACE 4. Coags - he is tolerating eliquis with no bleeding.  Sharlot Gowda Shiya Fogelman,MD

## 2020-08-09 ENCOUNTER — Ambulatory Visit (INDEPENDENT_AMBULATORY_CARE_PROVIDER_SITE_OTHER): Payer: Medicare Other

## 2020-08-09 DIAGNOSIS — I480 Paroxysmal atrial fibrillation: Secondary | ICD-10-CM

## 2020-08-09 LAB — CUP PACEART REMOTE DEVICE CHECK
Battery Remaining Longevity: 144 mo
Battery Remaining Percentage: 100 %
Brady Statistic RA Percent Paced: 0 %
Brady Statistic RV Percent Paced: 60 %
Date Time Interrogation Session: 20220131044100
Implantable Lead Implant Date: 20061026
Implantable Lead Implant Date: 20061026
Implantable Lead Location: 753859
Implantable Lead Location: 753860
Implantable Lead Model: 4053
Implantable Lead Model: 4054
Implantable Lead Serial Number: 416839
Implantable Lead Serial Number: 453015
Implantable Pulse Generator Implant Date: 20190408
Lead Channel Impedance Value: 608 Ohm
Lead Channel Impedance Value: 622 Ohm
Lead Channel Pacing Threshold Amplitude: 1.7 V
Lead Channel Pacing Threshold Pulse Width: 0.4 ms
Lead Channel Setting Pacing Amplitude: 3 V
Lead Channel Setting Pacing Pulse Width: 0.4 ms
Lead Channel Setting Sensing Sensitivity: 3 mV
Pulse Gen Serial Number: 822581

## 2020-08-16 NOTE — Progress Notes (Signed)
Remote pacemaker transmission.   

## 2020-10-22 ENCOUNTER — Other Ambulatory Visit: Payer: Self-pay

## 2020-10-22 ENCOUNTER — Ambulatory Visit (INDEPENDENT_AMBULATORY_CARE_PROVIDER_SITE_OTHER): Payer: Medicare Other | Admitting: Internal Medicine

## 2020-10-22 VITALS — BP 120/68 | HR 68 | Ht 72.0 in | Wt 229.0 lb

## 2020-10-22 DIAGNOSIS — Z950 Presence of cardiac pacemaker: Secondary | ICD-10-CM

## 2020-10-22 DIAGNOSIS — I495 Sick sinus syndrome: Secondary | ICD-10-CM | POA: Diagnosis not present

## 2020-10-22 DIAGNOSIS — I480 Paroxysmal atrial fibrillation: Secondary | ICD-10-CM | POA: Diagnosis not present

## 2020-10-22 NOTE — Progress Notes (Signed)
HPI Jacob Skinner returns today after having a fall. He is a pleasant 80 yo man with persistent atrial fib and rate control, HTN and dyslipidemia. His wife who acts as his interpreter notes that he has the sensation that the room is spinning. No associated palpitations. No sensation of heart racing.  Allergies  Allergen Reactions  . Ace Inhibitors Cough  . Diphenhydramine Hcl Rash  . Pheniramine-Pe-Apap Rash  . Phenylephrine-Pheniramine-Dm Rash  . Tamiflu [Oseltamivir] Itching and Rash     Current Outpatient Medications  Medication Sig Dispense Refill  . amLODipine-benazepril (LOTREL) 5-40 MG capsule Take 1 capsule by mouth daily.    Marland Kitchen apixaban (ELIQUIS) 5 MG TABS tablet Take 1 tablet (5 mg total) by mouth 2 (two) times daily. 60 tablet 11  . CRESTOR 20 MG tablet TAKE 1 TABLET BY MOUTH DAILY 90 tablet 0  . esomeprazole (NEXIUM) 40 MG capsule Take 40 mg by mouth daily.    . famotidine (PEPCID) 20 MG tablet Take 1 tablet by mouth in the morning and at bedtime.    . hydrochlorothiazide (HYDRODIURIL) 25 MG tablet TAKE 1 TABLET BY MOUTH EVERY DAY 90 tablet 0  . icosapent Ethyl (VASCEPA) 1 g capsule Take 1 capsule by mouth in the morning and at bedtime.    Marland Kitchen levothyroxine (SYNTHROID) 50 MCG tablet Take 1 tablet by mouth daily.    Marland Kitchen losartan (COZAAR) 25 MG tablet Take 1 tablet (25 mg total) by mouth daily. 90 tablet 3  . metoprolol (TOPROL-XL) 200 MG 24 hr tablet TAKE 1 TABLET BY MOUTH EVERY DAY 90 tablet 0  . omega-3 acid ethyl esters (LOVAZA) 1 G capsule Take 2 g by mouth daily.      No current facility-administered medications for this visit.     Past Medical History:  Diagnosis Date  . Atrial fibrillation (HCC)   . Hypertension   . Nephrolithiasis   . Other and unspecified hyperlipidemia   . Sinoatrial node dysfunction (HCC)     ROS:   All systems reviewed and negative except as noted in the HPI.   Past Surgical History:  Procedure Laterality Date  . INSERT /  REPLACE / REMOVE PACEMAKER     guidant 10-06  . PPM GENERATOR CHANGEOUT N/A 10/15/2017   Procedure: PPM GENERATOR CHANGEOUT;  Surgeon: Marinus Maw, MD;  Location: [facility] INVASIVE CV LAB;  Service: Cardiovascular;  Laterality: N/A;     Family History  Problem Relation Age of Onset  . Heart attack Mother   . Hypertension Father      Social History   Socioeconomic History  . Marital status: Married    Spouse name: Not on file  . Number of children: Not on file  . Years of education: Not on file  . Highest education level: Not on file  Occupational History  . Occupation: retired Garment/textile technologist: RETIRED  Tobacco Use  . Smoking status: Never Smoker  . Smokeless tobacco: Never Used  Vaping Use  . Vaping Use: Never used  Substance and Sexual Activity  . Alcohol use: Yes    Comment: minimal  . Drug use: No  . Sexual activity: Not on file  Other Topics Concern  . Not on file  Social History Narrative  . Not on file   Social Determinants of Health   Financial Resource Strain: Not on file  Food Insecurity: Not on file  Transportation Needs: Not on file  Physical Activity: Not on file  Stress: Not on file  Social Connections: Not on file  Intimate Partner Violence: Not on file     BP 120/68   Pulse 68   Ht 6' (1.829 m)   Wt 229 lb (103.9 kg)   SpO2 96%   BMI 31.06 kg/m   Physical Exam:  Well appearing NAD HEENT: Unremarkable Neck:  No JVD, no thyromegally Lymphatics:  No adenopathy Back:  No CVA tenderness Lungs:  Clear with no wheezes HEART:  IRegular rate rhythm, no murmurs, no rubs, no clicks Abd:  soft, positive bowel sounds, no organomegally, no rebound, no guarding Ext:  2 plus pulses, no edema, no cyanosis, no clubbing Skin:  No rashes no nodules Neuro:  CN II through XII intact, motor grossly intact   DEVICE  Normal device function.  See PaceArt for details.   Assess/Plan: 1. Recurrent falls - interrogation of his PPM demonstrates normal  device function and no VT.  2. HTN - he had orthostatic vitals today and he went from sitting to standing with no drop in his BP of around 140. 3. Atrial fib - his VR is well controlled.  4. Dyslipidemia - he will continue his statin therapy.   Jacob Gowda Rai Sinagra,MD

## 2020-10-22 NOTE — Patient Instructions (Addendum)
Medication Instructions:  Your physician recommends that you continue on your current medications as directed. Please refer to the Current Medication list given to you today.  Labwork: None ordered.  Testing/Procedures: None ordered.  Follow-Up: Your physician wants you to follow-up in: per previous recall with Lewayne Bunting, MD or one of the following Advanced Practice Providers on your designated Care Team:    Gypsy Balsam, NP  Francis Dowse, PA-C  Casimiro Needle "Mardelle Matte" [place], [place]  Remote monitoring is used to monitor your Pacemaker from home. This monitoring reduces the number of office visits required to check your device to one time per year. It allows [place] to keep an eye on the functioning of your device to ensure it is working properly. You are scheduled for a device check from home on 11/08/2020. You may send your transmission at any time that day. If you have a wireless device, the transmission will be sent automatically. After your physician reviews your transmission, you will receive a postcard with your next transmission date.  Any Other Special Instructions Will Be Listed Below (If Applicable).  If you need a refill on your cardiac medications before your next appointment, please call your pharmacy.

## 2020-11-08 ENCOUNTER — Ambulatory Visit (INDEPENDENT_AMBULATORY_CARE_PROVIDER_SITE_OTHER): Payer: Medicare Other

## 2020-11-08 DIAGNOSIS — I4819 Other persistent atrial fibrillation: Secondary | ICD-10-CM

## 2020-11-10 LAB — CUP PACEART REMOTE DEVICE CHECK
Battery Remaining Longevity: 150 mo
Battery Remaining Percentage: 100 %
Brady Statistic RA Percent Paced: 0 %
Brady Statistic RV Percent Paced: 46 %
Date Time Interrogation Session: 20220504155100
Implantable Lead Implant Date: 20061026
Implantable Lead Implant Date: 20061026
Implantable Lead Location: 753859
Implantable Lead Location: 753860
Implantable Lead Model: 4053
Implantable Lead Model: 4054
Implantable Lead Serial Number: 416839
Implantable Lead Serial Number: 453015
Implantable Pulse Generator Implant Date: 20190408
Lead Channel Impedance Value: 627 Ohm
Lead Channel Impedance Value: 637 Ohm
Lead Channel Pacing Threshold Amplitude: 1.7 V
Lead Channel Pacing Threshold Pulse Width: 0.4 ms
Lead Channel Setting Pacing Amplitude: 3 V
Lead Channel Setting Pacing Pulse Width: 0.4 ms
Lead Channel Setting Sensing Sensitivity: 3 mV
Pulse Gen Serial Number: 822581

## 2020-11-25 NOTE — Progress Notes (Signed)
Remote pacemaker transmission.   

## 2020-12-14 ENCOUNTER — Encounter: Payer: Medicare Other | Admitting: Internal Medicine

## 2021-02-07 ENCOUNTER — Ambulatory Visit (INDEPENDENT_AMBULATORY_CARE_PROVIDER_SITE_OTHER): Payer: Medicare Other

## 2021-02-07 DIAGNOSIS — I495 Sick sinus syndrome: Secondary | ICD-10-CM

## 2021-02-08 LAB — CUP PACEART REMOTE DEVICE CHECK
Battery Remaining Longevity: 138 mo
Battery Remaining Percentage: 100 %
Brady Statistic RA Percent Paced: 0 %
Brady Statistic RV Percent Paced: 49 %
Date Time Interrogation Session: 20220801044200
Implantable Lead Implant Date: 20061026
Implantable Lead Implant Date: 20061026
Implantable Lead Location: 753859
Implantable Lead Location: 753860
Implantable Lead Model: 4053
Implantable Lead Model: 4054
Implantable Lead Serial Number: 416839
Implantable Lead Serial Number: 453015
Implantable Pulse Generator Implant Date: 20190408
Lead Channel Impedance Value: 604 Ohm
Lead Channel Impedance Value: 628 Ohm
Lead Channel Pacing Threshold Amplitude: 1.8 V
Lead Channel Pacing Threshold Pulse Width: 0.4 ms
Lead Channel Setting Pacing Amplitude: 3 V
Lead Channel Setting Pacing Pulse Width: 0.4 ms
Lead Channel Setting Sensing Sensitivity: 3 mV
Pulse Gen Serial Number: 822581

## 2021-03-02 NOTE — Progress Notes (Signed)
Remote pacemaker transmission.   

## 2021-05-09 ENCOUNTER — Ambulatory Visit (INDEPENDENT_AMBULATORY_CARE_PROVIDER_SITE_OTHER): Payer: Medicare Other

## 2021-05-09 DIAGNOSIS — I495 Sick sinus syndrome: Secondary | ICD-10-CM | POA: Diagnosis not present

## 2021-05-10 LAB — CUP PACEART REMOTE DEVICE CHECK
Battery Remaining Longevity: 144 mo
Battery Remaining Percentage: 100 %
Brady Statistic RA Percent Paced: 0 %
Brady Statistic RV Percent Paced: 45 %
Date Time Interrogation Session: 20221031044000
Implantable Lead Implant Date: 20061026
Implantable Lead Implant Date: 20061026
Implantable Lead Location: 753859
Implantable Lead Location: 753860
Implantable Lead Model: 4053
Implantable Lead Model: 4054
Implantable Lead Serial Number: 416839
Implantable Lead Serial Number: 453015
Implantable Pulse Generator Implant Date: 20190408
Lead Channel Impedance Value: 577 Ohm
Lead Channel Impedance Value: 617 Ohm
Lead Channel Pacing Threshold Amplitude: 2.1 V
Lead Channel Pacing Threshold Pulse Width: 0.4 ms
Lead Channel Setting Pacing Amplitude: 3 V
Lead Channel Setting Pacing Pulse Width: 0.4 ms
Lead Channel Setting Sensing Sensitivity: 3 mV
Pulse Gen Serial Number: 822581

## 2021-05-16 NOTE — Progress Notes (Signed)
Remote pacemaker transmission.   

## 2021-05-18 ENCOUNTER — Ambulatory Visit (INDEPENDENT_AMBULATORY_CARE_PROVIDER_SITE_OTHER): Payer: Medicare Other | Admitting: Internal Medicine

## 2021-05-18 ENCOUNTER — Other Ambulatory Visit: Payer: Self-pay

## 2021-05-18 VITALS — BP 134/76 | HR 67 | Ht 72.0 in | Wt 221.8 lb

## 2021-05-18 DIAGNOSIS — I480 Paroxysmal atrial fibrillation: Secondary | ICD-10-CM

## 2021-05-18 DIAGNOSIS — I495 Sick sinus syndrome: Secondary | ICD-10-CM

## 2021-05-18 DIAGNOSIS — Z950 Presence of cardiac pacemaker: Secondary | ICD-10-CM | POA: Diagnosis not present

## 2021-05-18 NOTE — Patient Instructions (Addendum)
Medication Instructions:  Your physician recommends that you continue on your current medications as directed. Please refer to the Current Medication list given to you today.  Labwork: None ordered.  Testing/Procedures: None ordered.  Follow-Up: Your physician wants you to follow-up in: 6 months with Jacob Bunting, MD   Remote monitoring is used to monitor your Pacemaker from home. This monitoring reduces the number of office visits required to check your device to one time per year. It allows [place] to keep an eye on the functioning of your device to ensure it is working properly. You are scheduled for a device check from home on 08/08/2021. You may send your transmission at any time that day. If you have a wireless device, the transmission will be sent automatically. After your physician reviews your transmission, you will receive a postcard with your next transmission date.  Any Other Special Instructions Will Be Listed Below (If Applicable).  If you need a refill on your cardiac medications before your next appointment, please call your pharmacy.

## 2021-05-18 NOTE — Progress Notes (Signed)
HPI Jacob Skinner returns today after having a fall. He is a pleasant 80 yo man with persistent atrial fib and rate control, HTN and dyslipidemia. His wife who acts as his interpreter notes that he has felt better. No associated palpitations. No sensation of heart racing. He and his wife recently traveled to [place].  Allergies  Allergen Reactions   Ace Inhibitors Cough   Diphenhydramine Hcl Rash   Pheniramine-Pe-Apap Rash   Phenylephrine-Pheniramine-Dm Rash   Tamiflu [Oseltamivir] Itching and Rash     Current Outpatient Medications  Medication Sig Dispense Refill   amLODipine-benazepril (LOTREL) 5-40 MG capsule Take 1 capsule by mouth daily.     apixaban (ELIQUIS) 5 MG TABS tablet Take 1 tablet (5 mg total) by mouth 2 (two) times daily. 60 tablet 11   CRESTOR 20 MG tablet TAKE 1 TABLET BY MOUTH DAILY 90 tablet 0   esomeprazole (NEXIUM) 40 MG capsule Take 40 mg by mouth daily.     famotidine (PEPCID) 20 MG tablet Take 1 tablet by mouth in the morning and at bedtime.     hydrochlorothiazide (HYDRODIURIL) 25 MG tablet TAKE 1 TABLET BY MOUTH EVERY DAY 90 tablet 0   icosapent Ethyl (VASCEPA) 1 g capsule Take 1 capsule by mouth in the morning and at bedtime.     metoprolol (TOPROL-XL) 200 MG 24 hr tablet TAKE 1 TABLET BY MOUTH EVERY DAY 90 tablet 0   omega-3 acid ethyl esters (LOVAZA) 1 G capsule Take 2 g by mouth daily.      levothyroxine (SYNTHROID) 50 MCG tablet Take 1 tablet by mouth daily.     losartan (COZAAR) 25 MG tablet Take 1 tablet (25 mg total) by mouth daily. 90 tablet 3   No current facility-administered medications for this visit.     Past Medical History:  Diagnosis Date   Atrial fibrillation (HCC)    Hypertension    Nephrolithiasis    Other and unspecified hyperlipidemia    Sinoatrial node dysfunction (HCC)     ROS:   All systems reviewed and negative except as noted in the HPI.   Past Surgical History:  Procedure Laterality Date   INSERT / REPLACE /  REMOVE PACEMAKER     guidant 10-06   PPM GENERATOR CHANGEOUT N/A 10/15/2017   Procedure: PPM GENERATOR CHANGEOUT;  Surgeon: Marinus Maw, MD;  Location: [facility] INVASIVE CV LAB;  Service: Cardiovascular;  Laterality: N/A;     Family History  Problem Relation Age of Onset   Heart attack Mother    Hypertension Father      Social History   Socioeconomic History   Marital status: Married    Spouse name: Not on file   Number of children: Not on file   Years of education: Not on file   Highest education level: Not on file  Occupational History   Occupation: retired Garment/textile technologist: RETIRED  Tobacco Use   Smoking status: Never   Smokeless tobacco: Never  Vaping Use   Vaping Use: Never used  Substance and Sexual Activity   Alcohol use: Yes    Comment: minimal   Drug use: No   Sexual activity: Not on file  Other Topics Concern   Not on file  Social History Narrative   Not on file   Social Determinants of Health   Financial Resource Strain: Not on file  Food Insecurity: Not on file  Transportation Needs: Not on file  Physical Activity: Not on file  Stress: Not on file  Social Connections: Not on file  Intimate Partner Violence: Not on file     BP 134/76   Pulse 67   Ht 6' (1.829 m)   Wt 221 lb 12.8 oz (100.6 kg)   SpO2 97%   BMI 30.08 kg/m   Physical Exam:  Well appearing NAD HEENT: Unremarkable Neck:  No JVD, no thyromegally Lymphatics:  No adenopathy Back:  No CVA tenderness Lungs:  Clear HEART:  Regular rate rhythm, no murmurs, no rubs, no clicks Abd:  soft, positive bowel sounds, no organomegally, no rebound, no guarding Ext:  2 plus pulses, no edema, no cyanosis, no clubbing Skin:  No rashes no nodules Neuro:  CN II through XII intact, motor grossly intact  EKG  DEVICE  Normal device function.  See PaceArt for details.   Assess/Plan:  1. Recurrent falls - interrogation of his PPM demonstrates normal device function and no VT.  2. HTN - his  symptoms of orthostasis have improved.  3. Atrial fib - his VR is well controlled.  4. Dyslipidemia - he will continue his statin therapy.    Sharlot Gowda Keiosha Cancro,MD

## 2021-05-19 LAB — CUP PACEART INCLINIC DEVICE CHECK
Brady Statistic RV Percent Paced: 45 %
Date Time Interrogation Session: 20221109000000
Implantable Lead Implant Date: 20061026
Implantable Lead Implant Date: 20061026
Implantable Lead Location: 753859
Implantable Lead Location: 753860
Implantable Lead Model: 4053
Implantable Lead Model: 4054
Implantable Lead Serial Number: 416839
Implantable Lead Serial Number: 453015
Implantable Pulse Generator Implant Date: 20190408
Lead Channel Impedance Value: 573 Ohm
Lead Channel Impedance Value: 630 Ohm
Lead Channel Pacing Threshold Amplitude: 1.6 V
Lead Channel Pacing Threshold Pulse Width: 0.4 ms
Lead Channel Setting Pacing Amplitude: 3 V
Lead Channel Setting Pacing Pulse Width: 0.4 ms
Lead Channel Setting Sensing Sensitivity: 3 mV
Pulse Gen Serial Number: 822581

## 2021-08-01 ENCOUNTER — Other Ambulatory Visit: Payer: Self-pay | Admitting: Internal Medicine

## 2021-08-01 DIAGNOSIS — I480 Paroxysmal atrial fibrillation: Secondary | ICD-10-CM

## 2021-08-08 ENCOUNTER — Ambulatory Visit (INDEPENDENT_AMBULATORY_CARE_PROVIDER_SITE_OTHER): Payer: Medicare Other

## 2021-08-08 DIAGNOSIS — I495 Sick sinus syndrome: Secondary | ICD-10-CM | POA: Diagnosis not present

## 2021-08-08 LAB — CUP PACEART REMOTE DEVICE CHECK
Battery Remaining Longevity: 138 mo
Battery Remaining Percentage: 100 %
Brady Statistic RA Percent Paced: 0 %
Brady Statistic RV Percent Paced: 47 %
Date Time Interrogation Session: 20230130050800
Implantable Lead Implant Date: 20061026
Implantable Lead Implant Date: 20061026
Implantable Lead Location: 753859
Implantable Lead Location: 753860
Implantable Lead Model: 4053
Implantable Lead Model: 4054
Implantable Lead Serial Number: 416839
Implantable Lead Serial Number: 453015
Implantable Pulse Generator Implant Date: 20190408
Lead Channel Impedance Value: 582 Ohm
Lead Channel Impedance Value: 621 Ohm
Lead Channel Pacing Threshold Amplitude: 1.7 V
Lead Channel Pacing Threshold Pulse Width: 0.4 ms
Lead Channel Setting Pacing Amplitude: 3 V
Lead Channel Setting Pacing Pulse Width: 0.4 ms
Lead Channel Setting Sensing Sensitivity: 3 mV
Pulse Gen Serial Number: 822581

## 2021-08-15 NOTE — Progress Notes (Signed)
Remote pacemaker transmission.   

## 2021-09-23 ENCOUNTER — Telehealth: Payer: Self-pay | Admitting: Internal Medicine

## 2021-09-23 NOTE — Telephone Encounter (Signed)
Attempted to call pt via Interpretor services [phone]), but no answer. Left message for patient to call office back regarding husband's fall. Will go ahead and route message to device clinic to review his report for any findings. ?

## 2021-09-23 NOTE — Telephone Encounter (Signed)
When patient calls back, please have him send a remote transmission. Also, if they need assistance, transfer to [phone]. ?

## 2021-09-23 NOTE — Telephone Encounter (Signed)
STAT if patient feels like he/she is going to faint  ? ?Are you dizzy now? no ? ?Do you feel faint or have you passed out? Patient fell yesterday ? ?Do you have any other symptoms? no ? ?Have you checked your HR and BP (record if available)? No ? ?Wife is not sure if the patient needs to be checked out or not. She also wanted to know if anything showed up on his device report  ?

## 2021-09-27 NOTE — Telephone Encounter (Signed)
Spoke to patients wife Tatitana (DPR), requested a manual tansmission. States she is not home right now but will be after 4 pm. Advised someone will call today after 4 pm to help with transmission.  ?

## 2021-09-28 NOTE — Telephone Encounter (Signed)
Remote transmission received and reviewed. No alerts noted on device around time of call. Advised to f/u with PCP. Has apt this Friday per wife. Advised to call if further questions or concerns arise. Voiced understanding. ?

## 2021-11-07 ENCOUNTER — Ambulatory Visit (INDEPENDENT_AMBULATORY_CARE_PROVIDER_SITE_OTHER): Payer: Medicare Other

## 2021-11-07 DIAGNOSIS — I495 Sick sinus syndrome: Secondary | ICD-10-CM | POA: Diagnosis not present

## 2021-11-07 LAB — CUP PACEART REMOTE DEVICE CHECK
Battery Remaining Longevity: 132 mo
Battery Remaining Percentage: 100 %
Brady Statistic RA Percent Paced: 0 %
Brady Statistic RV Percent Paced: 50 %
Date Time Interrogation Session: 20230501044100
Implantable Lead Implant Date: 20061026
Implantable Lead Implant Date: 20061026
Implantable Lead Location: 753859
Implantable Lead Location: 753860
Implantable Lead Model: 4053
Implantable Lead Model: 4054
Implantable Lead Serial Number: 416839
Implantable Lead Serial Number: 453015
Implantable Pulse Generator Implant Date: 20190408
Lead Channel Impedance Value: 579 Ohm
Lead Channel Impedance Value: 600 Ohm
Lead Channel Pacing Threshold Amplitude: 2 V
Lead Channel Pacing Threshold Pulse Width: 0.4 ms
Lead Channel Setting Pacing Amplitude: 3 V
Lead Channel Setting Pacing Pulse Width: 0.4 ms
Lead Channel Setting Sensing Sensitivity: 3 mV
Pulse Gen Serial Number: 822581

## 2021-11-21 NOTE — Progress Notes (Signed)
Remote pacemaker transmission.   

## 2021-11-25 ENCOUNTER — Encounter: Payer: Self-pay | Admitting: Internal Medicine

## 2021-11-25 ENCOUNTER — Ambulatory Visit (INDEPENDENT_AMBULATORY_CARE_PROVIDER_SITE_OTHER): Payer: Medicare Other | Admitting: Internal Medicine

## 2021-11-25 DIAGNOSIS — I495 Sick sinus syndrome: Secondary | ICD-10-CM | POA: Diagnosis not present

## 2021-11-25 NOTE — Patient Instructions (Addendum)
Medication Instructions:  Your physician recommends that you continue on your current medications as directed. Please refer to the Current Medication list given to you today.  Labwork: None ordered.  Testing/Procedures: None ordered.  Follow-Up: Your physician wants you to follow-up in: 6 months with Lewayne Bunting, MD  Remote monitoring is used to monitor your Pacemaker from home. This monitoring reduces the number of office visits required to check your device to one time per year. It allows [place] to keep an eye on the functioning of your device to ensure it is working properly. You are scheduled for a device check from home on 02/06/2022. You may send your transmission at any time that day. If you have a wireless device, the transmission will be sent automatically. After your physician reviews your transmission, you will receive a postcard with your next transmission date.  Any Other Special Instructions Will Be Listed Below (If Applicable).  If you need a refill on your cardiac medications before your next appointment, please call your pharmacy.   Important Information About Sugar

## 2021-11-25 NOTE — Progress Notes (Signed)
HPI [name] returns today for followup. He is a pleasant 81 yo man with persistent atrial fib and rate control, HTN and dyslipidemia. His wife who acts as his interpreter notes that he has felt better. No associated palpitations. No sensation of heart racing. He and his wife were in  [place] almost a year ago. He has had another fall about 2 months ago. He stood up and fell out. He is not sure that he lost consciousness. He has a very mild tremor which is episodic.   Allergies  Allergen Reactions   Ace Inhibitors Cough   Diphenhydramine Hcl Rash   Pheniramine-Pe-Apap Rash   Phenylephrine-Pheniramine-Dm Rash   Tamiflu [Oseltamivir] Itching and Rash     Current Outpatient Medications  Medication Sig Dispense Refill   amLODipine-benazepril (LOTREL) 5-40 MG capsule Take 1 capsule by mouth daily.     apixaban (ELIQUIS) 5 MG TABS tablet Take 1 tablet (5 mg total) by mouth 2 (two) times daily. 60 tablet 11   CRESTOR 20 MG tablet TAKE 1 TABLET BY MOUTH DAILY 90 tablet 0   esomeprazole (NEXIUM) 40 MG capsule Take 40 mg by mouth daily.     famotidine (PEPCID) 20 MG tablet Take 1 tablet by mouth in the morning and at bedtime.     hydrochlorothiazide (HYDRODIURIL) 25 MG tablet TAKE 1 TABLET BY MOUTH EVERY DAY 90 tablet 0   icosapent Ethyl (VASCEPA) 1 g capsule Take 1 capsule by mouth in the morning and at bedtime.     levothyroxine (SYNTHROID) 50 MCG tablet Take 1 tablet by mouth daily.     losartan (COZAAR) 25 MG tablet Take 1 tablet (25 mg total) by mouth daily. 90 tablet 3   metoprolol (TOPROL-XL) 200 MG 24 hr tablet TAKE 1 TABLET BY MOUTH EVERY DAY 90 tablet 0   omega-3 acid ethyl esters (LOVAZA) 1 G capsule Take 2 g by mouth daily.      No current facility-administered medications for this visit.     Past Medical History:  Diagnosis Date   Atrial fibrillation (HCC)    Hypertension    Nephrolithiasis    Other and unspecified hyperlipidemia    Sinoatrial node dysfunction  (HCC)     ROS:   All systems reviewed and negative except as noted in the HPI.   Past Surgical History:  Procedure Laterality Date   INSERT / REPLACE / REMOVE PACEMAKER     guidant 10-06   PPM GENERATOR CHANGEOUT N/A 10/15/2017   Procedure: PPM GENERATOR CHANGEOUT;  Surgeon: Marinus Maw, MD;  Location: [facility] INVASIVE CV LAB;  Service: Cardiovascular;  Laterality: N/A;     Family History  Problem Relation Age of Onset   Heart attack Mother    Hypertension Father      Social History   Socioeconomic History   Marital status: Married    Spouse name: Not on file   Number of children: Not on file   Years of education: Not on file   Highest education level: Not on file  Occupational History   Occupation: retired Garment/textile technologist: RETIRED  Tobacco Use   Smoking status: Never   Smokeless tobacco: Never  Vaping Use   Vaping Use: Never used  Substance and Sexual Activity   Alcohol use: Yes    Comment: minimal   Drug use: No   Sexual activity: Not on file  Other Topics Concern   Not on file  Social History Narrative  Not on file   Social Determinants of Health   Financial Resource Strain: Not on file  Food Insecurity: Not on file  Transportation Needs: Not on file  Physical Activity: Not on file  Stress: Not on file  Social Connections: Not on file  Intimate Partner Violence: Not on file     BP 128/84   Pulse 72   Ht 5\' 10"  (1.778 m)   Wt 230 lb 6.4 oz (104.5 kg)   SpO2 95%   BMI 33.06 kg/m   Physical Exam:  Well appearing 81 yo man, NAD HEENT: Unremarkable Neck:  6 cm JVD, no thyromegally Lymphatics:  No adenopathy Back:  No CVA tenderness Lungs:  Clear with no wheezes HEART:  Regular rate rhythm, no murmurs, no rubs, no clicks Abd:  soft, positive bowel sounds, no organomegally, no rebound, no guarding Ext:  2 plus pulses, no edema, no cyanosis, no clubbing Skin:  No rashes no nodules Neuro:  CN II through XII intact, motor grossly  intact  DEVICE  Normal device function.  See PaceArt for details.   Assess/Plan:  1. Recurrent falls - interrogation of his PPM demonstrates normal device function and no sustained VT. If they continue I would recommend a referral to neurology to rule out Parkinsons.  2. HTN - his symptoms of orthostasis have improved.  3. Atrial fib - his VR is well controlled.  4. Dyslipidemia - he will continue his statin therapy.    [address] Cavazos,MD

## 2022-02-06 ENCOUNTER — Ambulatory Visit (INDEPENDENT_AMBULATORY_CARE_PROVIDER_SITE_OTHER): Payer: Medicare Other

## 2022-02-06 DIAGNOSIS — I480 Paroxysmal atrial fibrillation: Secondary | ICD-10-CM

## 2022-02-09 ENCOUNTER — Telehealth: Payer: Self-pay | Admitting: Internal Medicine

## 2022-02-09 NOTE — Telephone Encounter (Signed)
°  1. Has your device fired? no ° °2. Is you device beeping? no ° °3. Are you experiencing draining or swelling at device site?no ° °4. Are you calling to see if we received your device transmission?yes ° °5. Have you passed out? no ° ° ° °Please route to Device Clinic Pool °

## 2022-02-10 NOTE — Telephone Encounter (Signed)
I let Kyla Balzarine know we got the patient transmission on 02-06-2022

## 2022-03-10 NOTE — Progress Notes (Signed)
Remote pacemaker transmission.   

## 2022-06-08 ENCOUNTER — Encounter: Payer: Self-pay | Admitting: Internal Medicine

## 2022-06-08 ENCOUNTER — Ambulatory Visit: Payer: Medicare Other | Attending: Internal Medicine | Admitting: Internal Medicine

## 2022-06-08 VITALS — BP 146/90 | HR 68 | Ht 70.0 in | Wt 233.8 lb

## 2022-06-08 DIAGNOSIS — I480 Paroxysmal atrial fibrillation: Secondary | ICD-10-CM

## 2022-06-08 DIAGNOSIS — I10 Essential (primary) hypertension: Secondary | ICD-10-CM

## 2022-06-08 LAB — CUP PACEART INCLINIC DEVICE CHECK
Date Time Interrogation Session: 20231130121815
Implantable Lead Connection Status: 753985
Implantable Lead Connection Status: 753985
Implantable Lead Implant Date: 20061026
Implantable Lead Implant Date: 20061026
Implantable Lead Location: 753859
Implantable Lead Location: 753860
Implantable Lead Model: 4053
Implantable Lead Model: 4054
Implantable Lead Serial Number: 416839
Implantable Lead Serial Number: 453015
Implantable Pulse Generator Implant Date: 20190408
Lead Channel Impedance Value: 597 Ohm
Lead Channel Impedance Value: 659 Ohm
Lead Channel Pacing Threshold Amplitude: 0 V
Lead Channel Pacing Threshold Amplitude: 1.6 V
Lead Channel Pacing Threshold Amplitude: 1.6 V
Lead Channel Pacing Threshold Pulse Width: 0 ms
Lead Channel Pacing Threshold Pulse Width: 0.4 ms
Lead Channel Pacing Threshold Pulse Width: 0.4 ms
Lead Channel Setting Pacing Amplitude: 3 V
Lead Channel Setting Pacing Pulse Width: 0.4 ms
Lead Channel Setting Sensing Sensitivity: 3 mV
Pulse Gen Serial Number: 822581
Zone Setting Status: 755011

## 2022-06-08 NOTE — Patient Instructions (Addendum)
Medication Instructions:  Your physician recommends that you continue on your current medications as directed. Please refer to the Current Medication list given to you today.  *If you need a refill on your cardiac medications before your next appointment, please call your pharmacy*  Lab Work: None ordered.  If you have labs (blood work) drawn today and your tests are completely normal, you will receive your results only by: MyChart Message (if you have MyChart) OR A paper copy in the mail If you have any lab test that is abnormal or we need to change your treatment, we will call you to review the results.  Testing/Procedures: None ordered.  Follow-Up: At [facility], you and your health needs are our priority.  As part of our continuing mission to provide you with exceptional heart care, we have created designated Provider Care Teams.  These Care Teams include your primary Cardiologist (physician) and Advanced Practice Providers (APPs -  Physician Assistants and Nurse Practitioners) who all work together to provide you with the care you need, when you need it.  We recommend signing up for the patient portal called "MyChart".  Sign up information is provided on this After Visit Summary.  MyChart is used to connect with patients for Virtual Visits (Telemedicine).  Patients are able to view lab/test results, encounter notes, upcoming appointments, etc.  Non-urgent messages can be sent to your provider as well.   To learn more about what you can do with MyChart, go to [email or web address].    Your next appointment:   Please schedule a 6 month follow up appointment with Dr. Lewayne Bunting   The format for your next appointment:   In Person  Provider:   Lewayne Bunting, MD{or one of the following Advanced Practice Providers on your designated Care Team:   Francis Dowse, [place] Casimiro Needle "Mardelle Matte" Lanna Poche, [place]  Remote monitoring is used to monitor your Pacemaker from home. This monitoring reduces  the number of office visits required to check your device to one time per year. It allows [place] to keep an eye on the functioning of your device to ensure it is working properly. You are scheduled for a device check from home on 08/07/22. You may send your transmission at any time that day. If you have a wireless device, the transmission will be sent automatically. After your physician reviews your transmission, you will receive a postcard with your next transmission date.  Important Information About Sugar

## 2022-06-08 NOTE — Progress Notes (Signed)
HPI [name] returns today for followup. He is a pleasant 81 yo man with persistent atrial fib and rate control, HTN and dyslipidemia. His wife who acts as his interpreter notes that he has felt better. No associated palpitations. No sensation of heart racing. He has had some dizzy spells. He stood up and fell out.  He has a very mild tremor which is episodic.    Allergies  Allergen Reactions   Ace Inhibitors Cough   Diphenhydramine Hcl Rash   Pheniramine-Pe-Apap Rash   Phenylephrine-Pheniramine-Dm Rash   Tamiflu [Oseltamivir] Itching and Rash     Current Outpatient Medications  Medication Sig Dispense Refill   amLODipine (NORVASC) 5 MG tablet Take 5 mg by mouth daily.     apixaban (ELIQUIS) 5 MG TABS tablet Take 1 tablet (5 mg total) by mouth 2 (two) times daily. 60 tablet 11   CRESTOR 20 MG tablet TAKE 1 TABLET BY MOUTH DAILY 90 tablet 0   famotidine (PEPCID) 20 MG tablet Take 1 tablet by mouth in the morning and at bedtime.     hydrochlorothiazide (HYDRODIURIL) 25 MG tablet TAKE 1 TABLET BY MOUTH EVERY DAY 90 tablet 0   icosapent Ethyl (VASCEPA) 1 g capsule Take 1 capsule by mouth in the morning and at bedtime.     metFORMIN (GLUCOPHAGE) 1000 MG tablet Take 750 mg by mouth daily with breakfast.     metoprolol (TOPROL-XL) 200 MG 24 hr tablet TAKE 1 TABLET BY MOUTH EVERY DAY 90 tablet 0   omega-3 acid ethyl esters (LOVAZA) 1 G capsule Take 2 g by mouth daily.      levothyroxine (SYNTHROID) 50 MCG tablet Take 1 tablet by mouth daily.     losartan (COZAAR) 25 MG tablet Take 1 tablet (25 mg total) by mouth daily. 90 tablet 3   No current facility-administered medications for this visit.     Past Medical History:  Diagnosis Date   Atrial fibrillation (HCC)    Hypertension    Nephrolithiasis    Other and unspecified hyperlipidemia    Sinoatrial node dysfunction (HCC)     ROS:   All systems reviewed and negative except as noted in the HPI.   Past Surgical History:   Procedure Laterality Date   INSERT / REPLACE / REMOVE PACEMAKER     guidant 10-06   PPM GENERATOR CHANGEOUT N/A 10/15/2017   Procedure: PPM GENERATOR CHANGEOUT;  Surgeon: Marinus Maw, MD;  Location: [facility];  Service: Cardiovascular;  Laterality: N/A;     Family History  Problem Relation Age of Onset   Heart attack Mother    Hypertension Father      Social History   Socioeconomic History   Marital status: Married    Spouse name: Not on file   Number of children: Not on file   Years of education: Not on file   Highest education level: Not on file  Occupational History   Occupation: retired Garment/textile technologist: RETIRED  Tobacco Use   Smoking status: Never   Smokeless tobacco: Never  Vaping Use   Vaping Use: Never used  Substance and Sexual Activity   Alcohol use: Yes    Comment: minimal   Drug use: No   Sexual activity: Not on file  Other Topics Concern   Not on file  Social History Narrative   Not on file   Social Determinants of Health   Financial Resource Strain: Not on file  Food  Insecurity: Not on file  Transportation Needs: Not on file  Physical Activity: Not on file  Stress: Not on file  Social Connections: Not on file  Intimate Partner Violence: Not on file     BP (!) 146/90   Pulse 68   Ht 5\' 10"  (1.778 m)   Wt 233 lb 12.8 oz (106.1 kg)   SpO2 95%   BMI 33.55 kg/m   Physical Exam:  Well appearing NAD HEENT: Unremarkable Neck:  No JVD, no thyromegally Lymphatics:  No adenopathy Back:  No CVA tenderness Lungs:  Clear HEART:  Regular rate rhythm, no murmurs, no rubs, no clicks Abd:  soft, positive bowel sounds, no organomegally, no rebound, no guarding Ext:  2 plus pulses, no edema, no cyanosis, no clubbing Skin:  No rashes no nodules Neuro:  CN II through XII intact, motor grossly intact  EKG  DEVICE  Normal device function.  See PaceArt for details.   Assess/Plan: 1. Recurrent falls - interrogation of his PPM  demonstrates normal device function and no sustained VT. If they continue I would recommend a referral to neurology to rule out Parkinsons.  2. HTN - his symptoms of orthostasis have improved.  3. Atrial fib - his VR is well controlled.  4. Dyslipidemia - he will continue his statin therapy.    Sereena Marando,MD

## 2022-08-07 ENCOUNTER — Ambulatory Visit: Payer: 59

## 2022-08-07 DIAGNOSIS — I480 Paroxysmal atrial fibrillation: Secondary | ICD-10-CM | POA: Diagnosis not present

## 2022-09-19 LAB — CUP PACEART REMOTE DEVICE CHECK
Date Time Interrogation Session: 20240129103009
Implantable Lead Connection Status: 753985
Implantable Lead Connection Status: 753985
Implantable Lead Implant Date: 20061026
Implantable Lead Implant Date: 20061026
Implantable Lead Location: 753859
Implantable Lead Location: 753860
Implantable Lead Model: 4053
Implantable Lead Model: 4054
Implantable Lead Serial Number: 416839
Implantable Lead Serial Number: 453015
Implantable Pulse Generator Implant Date: 20190408
Pulse Gen Serial Number: 822581

## 2022-09-19 NOTE — Progress Notes (Signed)
Remote pacemaker transmission.   

## 2022-11-06 ENCOUNTER — Ambulatory Visit (INDEPENDENT_AMBULATORY_CARE_PROVIDER_SITE_OTHER): Payer: 59

## 2022-11-06 DIAGNOSIS — I480 Paroxysmal atrial fibrillation: Secondary | ICD-10-CM

## 2022-11-07 LAB — CUP PACEART REMOTE DEVICE CHECK
Battery Remaining Longevity: 126 mo
Battery Remaining Percentage: 100 %
Brady Statistic RA Percent Paced: 0 %
Brady Statistic RV Percent Paced: 31 %
Date Time Interrogation Session: 20240429044100
Implantable Lead Connection Status: 753985
Implantable Lead Connection Status: 753985
Implantable Lead Implant Date: 20061026
Implantable Lead Implant Date: 20061026
Implantable Lead Location: 753859
Implantable Lead Location: 753860
Implantable Lead Model: 4053
Implantable Lead Model: 4054
Implantable Lead Serial Number: 416839
Implantable Lead Serial Number: 453015
Implantable Pulse Generator Implant Date: 20190408
Lead Channel Impedance Value: 570 Ohm
Lead Channel Impedance Value: 611 Ohm
Lead Channel Setting Pacing Amplitude: 3 V
Lead Channel Setting Pacing Pulse Width: 0.4 ms
Lead Channel Setting Sensing Sensitivity: 3 mV
Pulse Gen Serial Number: 822581
Zone Setting Status: 755011

## 2022-12-06 NOTE — Progress Notes (Signed)
Remote pacemaker transmission.   

## 2022-12-07 ENCOUNTER — Ambulatory Visit: Payer: 59 | Attending: Internal Medicine | Admitting: Internal Medicine

## 2022-12-07 VITALS — BP 126/66 | HR 64 | Ht 70.0 in | Wt 232.0 lb

## 2022-12-07 DIAGNOSIS — I10 Essential (primary) hypertension: Secondary | ICD-10-CM | POA: Diagnosis not present

## 2022-12-07 DIAGNOSIS — Z950 Presence of cardiac pacemaker: Secondary | ICD-10-CM | POA: Diagnosis not present

## 2022-12-07 DIAGNOSIS — I480 Paroxysmal atrial fibrillation: Secondary | ICD-10-CM | POA: Diagnosis not present

## 2022-12-07 NOTE — Progress Notes (Signed)
HPI Jacob Skinner returns today for followup. He is a pleasant 82 yo man with persistent atrial fib and rate control, HTN and dyslipidemia. His wife who acts as his interpreter notes that he has felt better. No associated palpitations. No sensation of heart racing. He has had some dizzy spells. He stood up and fell out.  He has a very mild tremor which is episodic.    Allergies  Allergen Reactions   Ace Inhibitors Cough   Diphenhydramine Hcl Rash   Pheniramine-Pe-Apap Rash   Phenylephrine-Pheniramine-Dm Rash   Tamiflu [Oseltamivir] Itching and Rash     Current Outpatient Medications  Medication Sig Dispense Refill   amLODipine (NORVASC) 5 MG tablet Take 5 mg by mouth daily.     apixaban (ELIQUIS) 5 MG TABS tablet Take 1 tablet (5 mg total) by mouth 2 (two) times daily. 60 tablet 11   CRESTOR 20 MG tablet TAKE 1 TABLET BY MOUTH DAILY 90 tablet 0   famotidine (PEPCID) 20 MG tablet Take 1 tablet by mouth in the morning and at bedtime.     hydrochlorothiazide (HYDRODIURIL) 25 MG tablet TAKE 1 TABLET BY MOUTH EVERY DAY 90 tablet 0   icosapent Ethyl (VASCEPA) 1 g capsule Take 1 capsule by mouth in the morning and at bedtime.     metFORMIN (GLUCOPHAGE) 1000 MG tablet Take 750 mg by mouth daily with breakfast.     metoprolol (TOPROL-XL) 200 MG 24 hr tablet TAKE 1 TABLET BY MOUTH EVERY DAY 90 tablet 0   omega-3 acid ethyl esters (LOVAZA) 1 G capsule Take 2 g by mouth daily.      levothyroxine (SYNTHROID) 50 MCG tablet Take 1 tablet by mouth daily.     losartan (COZAAR) 25 MG tablet Take 1 tablet (25 mg total) by mouth daily. 90 tablet 3   No current facility-administered medications for this visit.     Past Medical History:  Diagnosis Date   Atrial fibrillation (HCC)    Hypertension    Nephrolithiasis    Other and unspecified hyperlipidemia    Sinoatrial node dysfunction (HCC)     ROS:   All systems reviewed and negative except as noted in the HPI.   Past Surgical History:   Procedure Laterality Date   INSERT / REPLACE / REMOVE PACEMAKER     guidant 10-06   PPM GENERATOR CHANGEOUT N/A 10/15/2017   Procedure: PPM GENERATOR CHANGEOUT;  Surgeon: Marinus Maw, MD;  Location: [facility];  Service: Cardiovascular;  Laterality: N/A;     Family History  Problem Relation Age of Onset   Heart attack Mother    Hypertension Father      Social History   Socioeconomic History   Marital status: Married    Spouse name: Not on file   Number of children: Not on file   Years of education: Not on file   Highest education level: Not on file  Occupational History   Occupation: retired Garment/textile technologist: RETIRED  Tobacco Use   Smoking status: Never   Smokeless tobacco: Never  Vaping Use   Vaping Use: Never used  Substance and Sexual Activity   Alcohol use: Yes    Comment: minimal   Drug use: No   Sexual activity: Not on file  Other Topics Concern   Not on file  Social History Narrative   Not on file   Social Determinants of Health   Financial Resource Strain: Not on file  Food  Insecurity: Not on file  Transportation Needs: Not on file  Physical Activity: Not on file  Stress: Not on file  Social Connections: Not on file  Intimate Partner Violence: Not on file     BP 126/66   Pulse 64   Ht 5\' 10"  (1.778 m)   Wt 232 lb (105.2 kg)   SpO2 98%   BMI 33.29 kg/m   Physical Exam:  Well appearing NAD HEENT: Unremarkable Neck:  No JVD, no thyromegally Lymphatics:  No adenopathy Back:  No CVA tenderness Lungs:  Clear with no wheezes HEART:  Regular rate rhythm, no murmurs, no rubs, no clicks Abd:  soft, positive bowel sounds, no organomegally, no rebound, no guarding Ext:  2 plus pulses, no edema, no cyanosis, no clubbing Skin:  No rashes no nodules Neuro:  CN II through XII intact, motor grossly intact  EKG - atrial fib with ventricular pacing and sensing.  DEVICE  Normal device function.  See PaceArt for details.    Assess/Plan:  1. Recurrent falls - none since his last visit. 2. HTN - his symptoms of orthostasis have improved.  3. Atrial fib - his VR is well controlled.  4. Dyslipidemia - he will continue his statin therapy.  5. PPM - his [employer] DDD PM is working normally. We will recheck in several months.    Jacob Gowda [name],MD

## 2022-12-07 NOTE — Patient Instructions (Addendum)
Medication Instructions:  Your physician recommends that you continue on your current medications as directed. Please refer to the Current Medication list given to you today.  *If you need a refill on your cardiac medications before your next appointment, please call your pharmacy*  Lab Work: None ordered.  If you have labs (blood work) drawn today and your tests are completely normal, you will receive your results only by: MyChart Message (if you have MyChart) OR A paper copy in the mail If you have any lab test that is abnormal or we need to change your treatment, we will call you to review the results.  Testing/Procedures: None ordered.  Follow-Up: At [facility], you and your health needs are our priority.  As part of our continuing mission to provide you with exceptional heart care, we have created designated Provider Care Teams.  These Care Teams include your primary Cardiologist (physician) and Advanced Practice Providers (APPs -  Physician Assistants and Nurse Practitioners) who all work together to provide you with the care you need, when you need it.   Your next appointment:   Please schedule a 6 month follow up appointment with Dr. Lewayne Bunting.   The format for your next appointment:   In Person  Provider:   Lewayne Bunting, MD{or one of the following Advanced Practice Providers on your designated Care Team:   Francis Dowse, [place] Casimiro Needle "Mardelle Matte" Lanna Poche, [place]  Remote monitoring is used to monitor your Pacemaker from home. This monitoring reduces the number of office visits required to check your device to one time per year. It allows [place] to keep an eye on the functioning of your device to ensure it is working properly. You are scheduled for a device check from home on 7/29. You may send your transmission at any time that day. If you have a wireless device, the transmission will be sent automatically. After your physician reviews your transmission, you will receive a postcard  with your next transmission date.

## 2023-02-05 ENCOUNTER — Ambulatory Visit (INDEPENDENT_AMBULATORY_CARE_PROVIDER_SITE_OTHER): Payer: 59

## 2023-02-05 DIAGNOSIS — I480 Paroxysmal atrial fibrillation: Secondary | ICD-10-CM | POA: Diagnosis not present

## 2023-02-20 NOTE — Progress Notes (Signed)
Remote pacemaker transmission.   

## 2023-05-07 ENCOUNTER — Ambulatory Visit (INDEPENDENT_AMBULATORY_CARE_PROVIDER_SITE_OTHER): Payer: 59

## 2023-05-07 DIAGNOSIS — I480 Paroxysmal atrial fibrillation: Secondary | ICD-10-CM | POA: Diagnosis not present

## 2023-05-09 LAB — CUP PACEART REMOTE DEVICE CHECK
Battery Remaining Longevity: 114 mo
Battery Remaining Percentage: 100 %
Brady Statistic RA Percent Paced: 0 %
Brady Statistic RV Percent Paced: 47 %
Date Time Interrogation Session: 20241029092100
Implantable Lead Connection Status: 753985
Implantable Lead Connection Status: 753985
Implantable Lead Implant Date: 20061026
Implantable Lead Implant Date: 20061026
Implantable Lead Location: 753859
Implantable Lead Location: 753860
Implantable Lead Model: 4053
Implantable Lead Model: 4054
Implantable Lead Serial Number: 416839
Implantable Lead Serial Number: 453015
Implantable Pulse Generator Implant Date: 20190408
Lead Channel Impedance Value: 679 Ohm
Lead Channel Setting Pacing Amplitude: 3 V
Lead Channel Setting Pacing Pulse Width: 0.4 ms
Lead Channel Setting Sensing Sensitivity: 3 mV
Pulse Gen Serial Number: 822581
Zone Setting Status: 755011

## 2023-05-23 NOTE — Progress Notes (Signed)
Remote pacemaker transmission.   

## 2023-06-12 ENCOUNTER — Ambulatory Visit: Payer: 59 | Attending: Internal Medicine | Admitting: Internal Medicine

## 2023-06-12 ENCOUNTER — Encounter: Payer: Self-pay | Admitting: Internal Medicine

## 2023-06-12 VITALS — BP 128/80 | HR 70 | Ht 70.0 in | Wt 236.6 lb

## 2023-06-12 DIAGNOSIS — I480 Paroxysmal atrial fibrillation: Secondary | ICD-10-CM | POA: Diagnosis not present

## 2023-06-12 DIAGNOSIS — I495 Sick sinus syndrome: Secondary | ICD-10-CM | POA: Diagnosis not present

## 2023-06-12 LAB — CUP PACEART INCLINIC DEVICE CHECK
Date Time Interrogation Session: 20241203123539
Implantable Lead Connection Status: 753985
Implantable Lead Connection Status: 753985
Implantable Lead Implant Date: 20061026
Implantable Lead Implant Date: 20061026
Implantable Lead Location: 753859
Implantable Lead Location: 753860
Implantable Lead Model: 4053
Implantable Lead Model: 4054
Implantable Lead Serial Number: 416839
Implantable Lead Serial Number: 453015
Implantable Pulse Generator Implant Date: 20190408
Lead Channel Impedance Value: 625 Ohm
Lead Channel Impedance Value: 647 Ohm
Lead Channel Pacing Threshold Amplitude: 0 V
Lead Channel Pacing Threshold Amplitude: 1.6 V
Lead Channel Pacing Threshold Amplitude: 1.6 V
Lead Channel Pacing Threshold Pulse Width: 0 ms
Lead Channel Pacing Threshold Pulse Width: 0.4 ms
Lead Channel Pacing Threshold Pulse Width: 0.4 ms
Lead Channel Sensing Intrinsic Amplitude: 21.4 mV
Lead Channel Setting Pacing Amplitude: 3 V
Lead Channel Setting Pacing Pulse Width: 0.4 ms
Lead Channel Setting Sensing Sensitivity: 3 mV
Pulse Gen Serial Number: 822581
Zone Setting Status: 755011

## 2023-06-12 NOTE — Patient Instructions (Signed)
Medication Instructions:  Your physician recommends that you continue on your current medications as directed. Please refer to the Current Medication list given to you today.  *If you need a refill on your cardiac medications before your next appointment, please call your pharmacy*  Lab Work: None ordered.  If you have labs (blood work) drawn today and your tests are completely normal, you will receive your results only by: MyChart Message (if you have MyChart) OR A paper copy in the mail If you have any lab test that is abnormal or we need to change your treatment, we will call you to review the results.  Testing/Procedures: None ordered.  Follow-Up: At [facility], you and your health needs are our priority.  As part of our continuing mission to provide you with exceptional heart care, we have created designated Provider Care Teams.  These Care Teams include your primary Cardiologist (physician) and Advanced Practice Providers (APPs -  Physician Assistants and Nurse Practitioners) who all work together to provide you with the care you need, when you need it.  We recommend signing up for the patient portal called "MyChart".  Sign up information is provided on this After Visit Summary.  MyChart is used to connect with patients for Virtual Visits (Telemedicine).  Patients are able to view lab/test results, encounter notes, upcoming appointments, etc.  Non-urgent messages can be sent to your provider as well.   To learn more about what you can do with MyChart, go to [email or web address].    Your next appointment:   6 months  The format for your next appointment:   In Person  Provider:   Lewayne Bunting, MD{or one of the following Advanced Practice Providers on your designated Care Team:   Francis Dowse, [place] Casimiro Needle "Mardelle Matte" [place], [place] Earnest Rosier, NP  Remote monitoring is used to monitor your Pacemaker/ ICD from home. This monitoring reduces the number of office visits required to  check your device to one time per year. It allows [place] to keep an eye on the functioning of your device to ensure it is working properly.   Important Information About Sugar

## 2023-06-12 NOTE — Progress Notes (Signed)
HPI [name] returns today for followup. He is a pleasant 82 yo man with persistent atrial fib and rate control, HTN and dyslipidemia. His wife who acts as his interpreter notes that he has felt better. No associated palpitations. No sensation of heart racing. He has a h/o some dizzy spells. No anginal symptoms.  Allergies  Allergen Reactions   Ace Inhibitors Cough   Diphenhydramine Hcl Rash   Pheniramine-Pe-Apap Rash   Phenylephrine-Pheniramine-Dm Rash   Tamiflu [Oseltamivir] Itching and Rash     Current Outpatient Medications  Medication Sig Dispense Refill   apixaban (ELIQUIS) 5 MG TABS tablet Take 1 tablet (5 mg total) by mouth 2 (two) times daily. 60 tablet 11   CRESTOR 20 MG tablet TAKE 1 TABLET BY MOUTH DAILY 90 tablet 0   famotidine (PEPCID) 20 MG tablet Take 1 tablet by mouth in the morning and at bedtime.     hydrochlorothiazide (HYDRODIURIL) 25 MG tablet TAKE 1 TABLET BY MOUTH EVERY DAY 90 tablet 0   icosapent Ethyl (VASCEPA) 1 g capsule Take 1 capsule by mouth in the morning and at bedtime.     metFORMIN (GLUCOPHAGE) 1000 MG tablet Take 750 mg by mouth daily with breakfast.     metoprolol (TOPROL-XL) 200 MG 24 hr tablet TAKE 1 TABLET BY MOUTH EVERY DAY 90 tablet 0   omega-3 acid ethyl esters (LOVAZA) 1 G capsule Take 2 g by mouth daily.      levothyroxine (SYNTHROID) 50 MCG tablet Take 1 tablet by mouth daily.     losartan (COZAAR) 25 MG tablet Take 1 tablet (25 mg total) by mouth daily. 90 tablet 3   No current facility-administered medications for this visit.     Past Medical History:  Diagnosis Date   Atrial fibrillation (HCC)    Hypertension    Nephrolithiasis    Other and unspecified hyperlipidemia    Sinoatrial node dysfunction (HCC)     ROS:   All systems reviewed and negative except as noted in the HPI.   Past Surgical History:  Procedure Laterality Date   INSERT / REPLACE / REMOVE PACEMAKER     guidant 10-06   PPM GENERATOR CHANGEOUT  N/A 10/15/2017   Procedure: PPM GENERATOR CHANGEOUT;  Surgeon: Jacob Maw, MD;  Location: [facility];  Service: Cardiovascular;  Laterality: N/A;     Family History  Problem Relation Age of Onset   Heart attack Mother    Hypertension Father      Social History   Socioeconomic History   Marital status: Married    Spouse name: Not on file   Number of children: Not on file   Years of education: Not on file   Highest education level: Not on file  Occupational History   Occupation: retired Garment/textile technologist: RETIRED  Tobacco Use   Smoking status: Never   Smokeless tobacco: Never  Vaping Use   Vaping status: Never Used  Substance and Sexual Activity   Alcohol use: Yes    Comment: minimal   Drug use: No   Sexual activity: Not on file  Other Topics Concern   Not on file  Social History Narrative   Not on file   Social Determinants of Health   Financial Resource Strain: High Risk (05/24/2023)   Received from [facility]   Overall Financial Resource Strain (CARDIA)    Difficulty of Paying Living Expenses: Very hard  Food Insecurity: No Food Insecurity (05/24/2023)  Received from [facility] Vital Sign    Worried About Running Out of Food in the Last Year: Never true    Ran Out of Food in the Last Year: Never true  Transportation Needs: No Transportation Needs (05/24/2023)   Received from [facility] - Transportation    Lack of Transportation (Medical): No    Lack of Transportation (Non-Medical): No  Physical Activity: Insufficiently Active (05/24/2023)   Received from [facility]   Exercise Vital Sign    Days of Exercise per Week: 1 day    Minutes of Exercise per Session: 10 min  Stress: Stress Concern Present (05/24/2023)   Received from [facility] - Occupational Stress Questionnaire    Feeling of Stress : Rather much  Social Connections: Moderately Integrated (05/24/2023)    Received from [facility]   Social Network    How would you rate your social network (family, work, friends)?: Adequate participation with social networks  Intimate Partner Violence: Not At Risk (05/24/2023)   Received from [facility]   HITS    Over the last 12 months how often did your partner physically hurt you?: Never    Over the last 12 months how often did your partner insult you or talk down to you?: Never    Over the last 12 months how often did your partner threaten you with physical harm?: Never    Over the last 12 months how often did your partner scream or curse at you?: Never     BP 128/80   Pulse 70   Ht 5\' 10"  (1.778 m)   Wt 236 lb 9.6 oz (107.3 kg)   SpO2 97%   BMI 33.95 kg/m   Physical Exam:  Well appearing 82 yo man, NAD HEENT: Unremarkable Neck:  No JVD, no thyromegally Lymphatics:  No adenopathy Back:  No CVA tenderness Lungs:  Clear with no wheezes HEART:  Regular rate rhythm, no murmurs, no rubs, no clicks Abd:  soft, positive bowel sounds, no organomegally, no rebound, no guarding Ext:  2 plus pulses, no edema, no cyanosis, no clubbing Skin:  No rashes no nodules Neuro:  CN II through XII intact, motor grossly intact  EKG - atrial fib with intermittent ventricular pacing  DEVICE  Normal device function.  See PaceArt for details.   Assess/Plan:  1. Recurrent falls - none since his last visit. 2. HTN - his symptoms of orthostasis have improved.  3. Atrial fib - his VR is well controlled.  4. Dyslipidemia - he will continue his statin therapy.  5. PPM - his [employer] DDD PM is working normally. We will recheck in several months.     Jacob Gowda Reace Breshears,MD

## 2023-08-06 ENCOUNTER — Ambulatory Visit (INDEPENDENT_AMBULATORY_CARE_PROVIDER_SITE_OTHER): Payer: Self-pay

## 2023-08-06 DIAGNOSIS — I480 Paroxysmal atrial fibrillation: Secondary | ICD-10-CM

## 2023-08-06 DIAGNOSIS — I495 Sick sinus syndrome: Secondary | ICD-10-CM

## 2023-08-08 ENCOUNTER — Encounter: Payer: Self-pay | Admitting: Internal Medicine

## 2023-08-08 LAB — CUP PACEART REMOTE DEVICE CHECK
Battery Remaining Longevity: 114 mo
Battery Remaining Percentage: 100 %
Brady Statistic RA Percent Paced: 0 %
Brady Statistic RV Percent Paced: 44 %
Date Time Interrogation Session: 20250127053500
Implantable Lead Connection Status: 753985
Implantable Lead Connection Status: 753985
Implantable Lead Implant Date: 20061026
Implantable Lead Implant Date: 20061026
Implantable Lead Location: 753859
Implantable Lead Location: 753860
Implantable Lead Model: 4053
Implantable Lead Model: 4054
Implantable Lead Serial Number: 416839
Implantable Lead Serial Number: 453015
Implantable Pulse Generator Implant Date: 20190408
Lead Channel Impedance Value: 713 Ohm
Lead Channel Setting Pacing Amplitude: 3 V
Lead Channel Setting Pacing Pulse Width: 0.4 ms
Lead Channel Setting Sensing Sensitivity: 3 mV
Pulse Gen Serial Number: 822581
Zone Setting Status: 755011

## 2023-09-12 NOTE — Progress Notes (Signed)
 Remote pacemaker transmission.

## 2023-09-12 NOTE — Addendum Note (Signed)
 Addended by: Geralyn Flash D on: 09/12/2023 12:18 PM   Modules accepted: Orders

## 2023-11-05 ENCOUNTER — Ambulatory Visit (INDEPENDENT_AMBULATORY_CARE_PROVIDER_SITE_OTHER): Payer: Self-pay

## 2023-11-05 DIAGNOSIS — I480 Paroxysmal atrial fibrillation: Secondary | ICD-10-CM

## 2023-11-05 DIAGNOSIS — I495 Sick sinus syndrome: Secondary | ICD-10-CM

## 2023-11-07 ENCOUNTER — Encounter: Payer: Self-pay | Admitting: Internal Medicine

## 2023-11-07 LAB — CUP PACEART REMOTE DEVICE CHECK
Battery Remaining Longevity: 108 mo
Battery Remaining Percentage: 100 %
Brady Statistic RA Percent Paced: 0 %
Brady Statistic RV Percent Paced: 40 %
Date Time Interrogation Session: 20250428044100
Implantable Lead Connection Status: 753985
Implantable Lead Connection Status: 753985
Implantable Lead Implant Date: 20061026
Implantable Lead Implant Date: 20061026
Implantable Lead Location: 753859
Implantable Lead Location: 753860
Implantable Lead Model: 4053
Implantable Lead Model: 4054
Implantable Lead Serial Number: 416839
Implantable Lead Serial Number: 453015
Implantable Pulse Generator Implant Date: 20190408
Lead Channel Impedance Value: 622 Ohm
Lead Channel Setting Pacing Amplitude: 3 V
Lead Channel Setting Pacing Pulse Width: 0.4 ms
Lead Channel Setting Sensing Sensitivity: 3 mV
Pulse Gen Serial Number: 822581
Zone Setting Status: 755011

## 2023-11-16 ENCOUNTER — Telehealth: Payer: Self-pay | Admitting: Internal Medicine

## 2023-11-16 NOTE — Telephone Encounter (Signed)
 Patient is currently in ED

## 2023-11-16 NOTE — Telephone Encounter (Signed)
 Pt c/o BP issue: STAT if pt c/o blurred vision, one-sided weakness or slurred speech.  STAT if BP is GREATER than 180/120 TODAY.  STAT if BP is LESS than 90/60 and SYMPTOMATIC TODAY  1. What is your BP concern? High BP  2. Have you taken any BP medication today?  No just eliquis  and tylenol   3. What are your last 5 BP readings?  Now - 182/86 1 hour ago - 170/74  11/13/23 - 197/109  4. Are you having any other symptoms (ex. Dizziness, headache, blurred vision, passed out)? No, states patient is just laying down right now.  Patient is at ED right now

## 2023-11-20 ENCOUNTER — Encounter: Payer: Self-pay | Admitting: Internal Medicine

## 2023-11-20 ENCOUNTER — Ambulatory Visit: Attending: Internal Medicine | Admitting: Internal Medicine

## 2023-11-20 VITALS — BP 118/68 | HR 77 | Ht 70.0 in | Wt 228.0 lb

## 2023-11-20 DIAGNOSIS — I495 Sick sinus syndrome: Secondary | ICD-10-CM | POA: Diagnosis not present

## 2023-11-20 DIAGNOSIS — I4819 Other persistent atrial fibrillation: Secondary | ICD-10-CM | POA: Diagnosis not present

## 2023-11-20 LAB — CUP PACEART INCLINIC DEVICE CHECK
Date Time Interrogation Session: 20250513123759
Implantable Lead Connection Status: 753985
Implantable Lead Connection Status: 753985
Implantable Lead Implant Date: 20061026
Implantable Lead Implant Date: 20061026
Implantable Lead Location: 753859
Implantable Lead Location: 753860
Implantable Lead Model: 4053
Implantable Lead Model: 4054
Implantable Lead Serial Number: 416839
Implantable Lead Serial Number: 453015
Implantable Pulse Generator Implant Date: 20190408
Lead Channel Impedance Value: 641 Ohm
Lead Channel Impedance Value: 692 Ohm
Lead Channel Pacing Threshold Amplitude: 0 V
Lead Channel Pacing Threshold Amplitude: 1.5 V
Lead Channel Pacing Threshold Amplitude: 1.6 V
Lead Channel Pacing Threshold Pulse Width: 0 ms
Lead Channel Pacing Threshold Pulse Width: 0.4 ms
Lead Channel Pacing Threshold Pulse Width: 0.4 ms
Lead Channel Sensing Intrinsic Amplitude: 22.3 mV
Lead Channel Setting Pacing Amplitude: 3 V
Lead Channel Setting Pacing Pulse Width: 0.4 ms
Lead Channel Setting Sensing Sensitivity: 3 mV
Pulse Gen Serial Number: 822581
Zone Setting Status: 755011

## 2023-11-20 MED ORDER — DILTIAZEM HCL ER COATED BEADS 180 MG PO CP24: 180.0000 mg | ORAL_CAPSULE | Freq: Every day | ORAL | 3 refills | Status: DC

## 2023-11-20 NOTE — Progress Notes (Signed)
 HPI Mr. Funaro returns today for followup. He is a pleasant 83 yo man with persistent atrial fib and rate control, HTN and dyslipidemia. His wife who acts as his interpreter notes that he was in the hospital with an elevated bp and his meds were changed. No associated palpitations.  His enzymes were negative and bp controlled. No evidence of bleeding (h/o black stool). He had previously had problems with proteinuria and leg swelling. Allergies  Allergen Reactions   Ace Inhibitors Cough   Diphenhydramine Hcl Rash   Pheniramine-Pe-Apap Rash   Phenylephrine-Pheniramine-Dm Rash   Tamiflu [Oseltamivir] Itching and Rash     Current Outpatient Medications  Medication Sig Dispense Refill   apixaban  (ELIQUIS ) 5 MG TABS tablet Take 1 tablet (5 mg total) by mouth 2 (two) times daily. 60 tablet 11   CRESTOR 20 MG tablet TAKE 1 TABLET BY MOUTH DAILY 90 tablet 0   famotidine (PEPCID) 20 MG tablet Take 1 tablet by mouth in the morning and at bedtime.     hydrochlorothiazide (HYDRODIURIL) 25 MG tablet TAKE 1 TABLET BY MOUTH EVERY DAY 90 tablet 0   icosapent Ethyl (VASCEPA) 1 g capsule Take 1 capsule by mouth in the morning and at bedtime.     metFORMIN (GLUCOPHAGE) 1000 MG tablet Take 750 mg by mouth daily with breakfast.     metoprolol (TOPROL-XL) 200 MG 24 hr tablet TAKE 1 TABLET BY MOUTH EVERY DAY 90 tablet 0   omega-3 acid ethyl esters (LOVAZA) 1 G capsule Take 2 g by mouth daily.      levothyroxine (SYNTHROID) 50 MCG tablet Take 1 tablet by mouth daily.     losartan  (COZAAR ) 25 MG tablet Take 1 tablet (25 mg total) by mouth daily. 90 tablet 3   No current facility-administered medications for this visit.     Past Medical History:  Diagnosis Date   Atrial fibrillation (HCC)    Hypertension    Nephrolithiasis    Other and unspecified hyperlipidemia    Sinoatrial node dysfunction (HCC)     ROS:   All systems reviewed and negative except as noted in the HPI.   Past Surgical  History:  Procedure Laterality Date   INSERT / REPLACE / REMOVE PACEMAKER     guidant 10-06   PPM GENERATOR CHANGEOUT N/A 10/15/2017   Procedure: PPM GENERATOR CHANGEOUT;  Surgeon: Tammie Fall, MD;  Location: [facility];  Service: Cardiovascular;  Laterality: N/A;     Family History  Problem Relation Age of Onset   Heart attack Mother    Hypertension Father      Social History   Socioeconomic History   Marital status: Married    Spouse name: Not on file   Number of children: Not on file   Years of education: Not on file   Highest education level: Not on file  Occupational History   Occupation: retired Garment/textile technologist: RETIRED  Tobacco Use   Smoking status: Never   Smokeless tobacco: Never  Vaping Use   Vaping status: Never Used  Substance and Sexual Activity   Alcohol use: Yes    Comment: minimal   Drug use: No   Sexual activity: Not on file  Other Topics Concern   Not on file  Social History Narrative   Not on file   Social Drivers of Health   Financial Resource Strain: Medium Risk (11/19/2023)   Received from [facility]   Overall Financial Resource Strain (  CARDIA)    Difficulty of Paying Living Expenses: Somewhat hard  Food Insecurity: Patient Declined (11/19/2023)   Received from [facility]   Hunger Vital Sign    Worried About Running Out of Food in the Last Year: Patient declined    Ran Out of Food in the Last Year: Patient declined  Transportation Needs: Patient Declined (11/19/2023)   Received from [facility] - Transportation    Lack of Transportation (Medical): Patient declined    Lack of Transportation (Non-Medical): Patient declined  Physical Activity: Insufficiently Active (11/19/2023)   Received from [facility]   Exercise Vital Sign    Days of Exercise per Week: 1 day    Minutes of Exercise per Session: 10 min  Stress: Stress Concern Present (11/19/2023)   Received from [facility]  Occupational [facility] - Occupational Stress Questionnaire    Feeling of Stress : Very much  Social Connections: Patient Declined (11/19/2023)   Received from [facility]   Social Network    How would you rate your social network (family, work, friends)?: Patient declined  Intimate Partner Violence: Not At Risk (11/19/2023)   Received from [facility]   HITS    Over the last 12 months how often did your partner physically hurt you?: Never    Over the last 12 months how often did your partner insult you or talk down to you?: Never    Over the last 12 months how often did your partner threaten you with physical harm?: Never    Over the last 12 months how often did your partner scream or curse at you?: Never     There were no vitals taken for this visit.  Physical Exam:  Well appearing 83 yo man, NAD HEENT: Unremarkable Neck:  No JVD, no thyromegally Lymphatics:  No adenopathy Back:  No CVA tenderness Lungs:  Clear with no wheezes HEART:  Regular rate rhythm, no murmurs, no rubs, no clicks Abd:  soft, positive bowel sounds, no organomegally, no rebound, no guarding Ext:  2 plus pulses, no edema, no cyanosis, no clubbing Skin:  No rashes no nodules Neuro:  CN II through XII intact, motor grossly intact  DEVICE  Normal device function.  See PaceArt for details.   Assess/Plan:   Recurrent falls - none since his last visit. 2. HTN - his symptoms of orthostasis have improved. However he has had some HTN and started back on amlodipine. Because of his prior problems with this med, we will stop amlodipine cardizem 180 daily 3. Atrial fib - his VR is well controlled.  4. Dyslipidemia - he will continue his statin therapy.  5. PPM - his [employer] DDD PM is working normally. We will recheck in several months.     Pete Brand Meila Berke,MD

## 2023-11-20 NOTE — Patient Instructions (Addendum)
 Medication Instructions:  Your physician has recommended you make the following change in your medication:  Stop amlodipine now! Start cardizem 180mg  daily.  Lab Work: None ordered.  You may go to any [facility] Location for your lab work:  [facility] - [address] ([facility]) - [address] [phone] [address]  [place] - [address]   [place]  - [address]   [place] - [address] - [address] [employer]  - [address] [place] - [address] ([facility]   If you have labs (blood work) drawn today and your tests are completely normal, you will receive your results only by: [employer] (if you have MyChart)  If you have any lab test that is abnormal or we need to change your treatment, we will call you or send a MyChart message to review the results.  Testing/Procedures: None ordered.  Follow-Up: At [facility], you and your health needs are our priority.  As part of our continuing mission to provide you with exceptional heart care, we have created designated Provider Care Teams.  These Care Teams include your primary Cardiologist (physician) and Advanced Practice Providers (APPs -  Physician Assistants and Nurse Practitioners) who all work together to provide you with the care you need, when you need it.   Your next appointment:   As scheduled

## 2023-11-30 ENCOUNTER — Encounter: Payer: Self-pay | Admitting: Internal Medicine

## 2023-12-06 ENCOUNTER — Encounter: Payer: 59 | Admitting: Internal Medicine

## 2023-12-20 NOTE — Progress Notes (Signed)
 Remote pacemaker transmission.

## 2023-12-20 NOTE — Addendum Note (Signed)
 Addended by: Edra Govern D on: 12/20/2023 10:09 AM   Modules accepted: Orders

## 2024-01-08 ENCOUNTER — Telehealth: Payer: Self-pay | Admitting: Internal Medicine

## 2024-01-08 NOTE — Telephone Encounter (Signed)
 Pt spouse called in asking to speak with nurse about some medication changes.

## 2024-01-08 NOTE — Telephone Encounter (Signed)
 Spoke with Pts wife per DPR. Dr Satko stopped pts hydrochlorothiazide due to low kidney function. They wanted to make sure Dr Waddell is agreeable with this plan. Well forward to Dr Waddell.

## 2024-01-10 ENCOUNTER — Encounter: Payer: Self-pay | Admitting: Internal Medicine

## 2024-01-10 NOTE — Telephone Encounter (Signed)
 Wife Murel) wants a call back to discuss update on patient's medication change.

## 2024-01-18 ENCOUNTER — Other Ambulatory Visit: Payer: Self-pay

## 2024-01-18 DIAGNOSIS — I10 Essential (primary) hypertension: Secondary | ICD-10-CM

## 2024-01-18 DIAGNOSIS — Z79899 Other long term (current) drug therapy: Secondary | ICD-10-CM

## 2024-01-18 DIAGNOSIS — I4819 Other persistent atrial fibrillation: Secondary | ICD-10-CM

## 2024-01-18 DIAGNOSIS — I480 Paroxysmal atrial fibrillation: Secondary | ICD-10-CM

## 2024-01-18 DIAGNOSIS — I495 Sick sinus syndrome: Secondary | ICD-10-CM

## 2024-01-24 ENCOUNTER — Encounter: Payer: Self-pay | Admitting: Internal Medicine

## 2024-01-24 ENCOUNTER — Ambulatory Visit: Attending: Internal Medicine | Admitting: Internal Medicine

## 2024-01-24 VITALS — BP 130/67 | HR 61 | Ht 70.0 in | Wt 234.0 lb

## 2024-01-24 DIAGNOSIS — I4819 Other persistent atrial fibrillation: Secondary | ICD-10-CM | POA: Diagnosis not present

## 2024-01-24 MED ORDER — DILTIAZEM HCL ER COATED BEADS 240 MG PO CP24: 240.0000 mg | ORAL_CAPSULE | Freq: Every day | ORAL | 0 refills | Status: DC

## 2024-01-24 MED ORDER — DILTIAZEM HCL ER COATED BEADS 240 MG PO CP24: 240.0000 mg | ORAL_CAPSULE | Freq: Every day | ORAL | 3 refills | Status: DC

## 2024-01-24 NOTE — Progress Notes (Signed)
 HPI Jacob Skinner returns today for followup. He is a pleasant 83 yo man with persistent atrial fib and rate control, HTN and dyslipidemia. His wife who acts as his interpreter notes that he was in the hospital with an elevated bp and his meds were changed. No associated palpitations.  His enzymes were negative and bp controlled. No evidence of bleeding (h/o black stool). He had previously had problems with proteinuria and leg swelling. His bp had gone up when his hydrochlorothiazide was stopped. He has started it back along with cardizem .  Allergies  Allergen Reactions   Ace Inhibitors Cough   Diphenhydramine Hcl Rash   Pheniramine-Pe-Apap Rash   Phenylephrine-Pheniramine-Dm Rash   Tamiflu [Oseltamivir] Itching and Rash     Current Outpatient Medications  Medication Sig Dispense Refill   apixaban  (ELIQUIS ) 5 MG TABS tablet Take 1 tablet (5 mg total) by mouth 2 (two) times daily. 60 tablet 11   CRESTOR 20 MG tablet TAKE 1 TABLET BY MOUTH DAILY 90 tablet 0   diltiazem  (CARDIZEM  CD) 180 MG 24 hr capsule Take 1 capsule (180 mg total) by mouth daily. 90 capsule 3   famotidine (PEPCID) 20 MG tablet Take 1 tablet by mouth in the morning and at bedtime.     hydrochlorothiazide (HYDRODIURIL) 25 MG tablet TAKE 1 TABLET BY MOUTH EVERY DAY 90 tablet 0   icosapent Ethyl (VASCEPA) 1 g capsule Take 1 capsule by mouth in the morning and at bedtime.     levothyroxine (SYNTHROID) 50 MCG tablet Take 1 tablet by mouth daily.     losartan  (COZAAR ) 25 MG tablet Take 1 tablet (25 mg total) by mouth daily. 90 tablet 3   metFORMIN (GLUCOPHAGE) 1000 MG tablet Take 750 mg by mouth daily with breakfast.     metoprolol (TOPROL-XL) 200 MG 24 hr tablet TAKE 1 TABLET BY MOUTH EVERY DAY 90 tablet 0   omega-3 acid ethyl esters (LOVAZA) 1 G capsule Take 2 g by mouth daily.      No current facility-administered medications for this visit.     Past Medical History:  Diagnosis Date   Atrial fibrillation (HCC)     Hypertension    Nephrolithiasis    Other and unspecified hyperlipidemia    Sinoatrial node dysfunction (HCC)     ROS:   All systems reviewed and negative except as noted in the HPI.   Past Surgical History:  Procedure Laterality Date   INSERT / REPLACE / REMOVE PACEMAKER     guidant 10-06   PPM GENERATOR CHANGEOUT N/A 10/15/2017   Procedure: PPM GENERATOR CHANGEOUT;  Surgeon: Waddell Danelle ORN, MD;  Location: [facility];  Service: Cardiovascular;  Laterality: N/A;     Family History  Problem Relation Age of Onset   Heart attack Mother    Hypertension Father      Social History   Socioeconomic History   Marital status: Married    Spouse name: Not on file   Number of children: Not on file   Years of education: Not on file   Highest education level: Not on file  Occupational History   Occupation: retired Garment/textile technologist: RETIRED  Tobacco Use   Smoking status: Never   Smokeless tobacco: Never  Vaping Use   Vaping status: Never Used  Substance and Sexual Activity   Alcohol use: Yes    Comment: minimal   Drug use: No   Sexual activity: Not on file  Other Topics  Concern   Not on file  Social History Narrative   Not on file   Social Drivers of Health   Financial Resource Strain: Medium Risk (11/19/2023)   Received from [facility]   Overall Financial Resource Strain (CARDIA)    Difficulty of Paying Living Expenses: Somewhat hard  Food Insecurity: Patient Declined (11/19/2023)   Received from [facility]   Hunger Vital Sign    Within the past 12 months, you worried that your food would run out before you got the money to buy more.: Patient declined    Within the past 12 months, the food you bought just didn't last and you didn't have money to get more.: Patient declined  Transportation Needs: Patient Declined (11/19/2023)   Received from [facility] - Transportation    Lack of Transportation (Medical): Patient declined    Lack of  Transportation (Non-Medical): Patient declined  Physical Activity: Insufficiently Active (11/19/2023)   Received from [facility]   Exercise Vital Sign    On average, how many days per week do you engage in moderate to strenuous exercise (like a brisk walk)?: 1 day    On average, how many minutes do you engage in exercise at this level?: 10 min  Stress: Stress Concern Present (11/19/2023)   Received from [facility] Occupational Health - Occupational Stress Questionnaire    Feeling of Stress : Very much  Social Connections: Patient Declined (11/19/2023)   Received from [facility]   Social Network    How would you rate your social network (family, work, friends)?: Patient declined  Intimate Partner Violence: Not At Risk (11/19/2023)   Received from [facility]   HITS    Over the last 12 months how often did your partner physically hurt you?: Never    Over the last 12 months how often did your partner insult you or talk down to you?: Never    Over the last 12 months how often did your partner threaten you with physical harm?: Never    Over the last 12 months how often did your partner scream or curse at you?: Never     BP 130/67   Pulse 61   Ht 5' 10 (1.778 m)   Wt 234 lb (106.1 kg)   SpO2 96%   BMI 33.58 kg/m   Physical Exam:  Well appearing NAD HEENT: Unremarkable Neck:  No JVD, no thyromegally Lymphatics:  No adenopathy Back:  No CVA tenderness Lungs:  Clear with no wheezes HEART:  Regular rate rhythm, no murmurs, no rubs, no clicks Abd:  soft, positive bowel sounds, no organomegally, no rebound, no guarding Ext:  2 plus pulses, no edema, no cyanosis, no clubbing Skin:  No rashes no nodules Neuro:  CN II through XII intact, motor grossly intact  DEVICE  Not checked.  Assess/Plan: Atrial fib - his VR is well controlled and he is asymptomatic. No change. HTN - he will continue hydrochlorothiazide, toprol, cardizem  and cozaar . I asked him  to increased the cardizem  to 240 mg daily. If his kidney function is ok, he will continue hydrochlorothiazide. Sinus node dysfunction - he is stable s/p PPM insertion.    Danelle Lucette Kratz,MD

## 2024-01-24 NOTE — Patient Instructions (Addendum)
 Medication Instructions:  Your physician has recommended you make the following change in your medication:  Increase cardizem  to 240mg  daily  Lab Work: Labs today  You may go to any [facility] for your lab work:  [facility] - [address] ([facility]) - [address] [phone] [address]  [place] - [address]   [place]  - [address]   [place] - [address] - [address] [employer]  - [address] [place] - [address] ([facility]   If you have labs (blood work) drawn today and your tests are completely normal, you will receive your results only by: [employer] (if you have MyChart)  If you have any lab test that is abnormal or we need to change your treatment, we will call you or send a MyChart message to review the results.  Testing/Procedures: None ordered.  Follow-Up: At [facility], you and your health needs are our priority.  As part of our continuing mission to provide you with exceptional heart care, we have created designated Provider Care Teams.  These Care Teams include your primary Cardiologist (physician) and Advanced Practice Providers (APPs -  Physician Assistants and Nurse Practitioners) who all work together to provide you with the care you need, when you need it.  Your next appointment:   1 year(s)  The format for your next appointment:   In Person  Provider:   Danelle Birmingham, MD{or one of the following Advanced Practice Providers on your designated Care Team:   Charlies Arthur, [place] Ozell Jodie Passey, [place] Leotis Barrack, NP  Note: Remote monitoring is used to monitor your Pacemaker/ ICD from home. This monitoring reduces the number of office visits required to check your device to one time per year. It allows us  to keep an eye on the functioning of your device to ensure it is working properly.

## 2024-01-25 ENCOUNTER — Encounter: Payer: Self-pay | Admitting: Internal Medicine

## 2024-01-25 LAB — CBC
Hematocrit: 48.4 % (ref 37.5–51.0)
Hemoglobin: 15.7 g/dL (ref 13.0–17.7)
MCH: 31.3 pg (ref 26.6–33.0)
MCHC: 32.4 g/dL (ref 31.5–35.7)
MCV: 96 fL (ref 79–97)
Platelets: 302 x10E3/uL (ref 150–450)
RBC: 5.02 x10E6/uL (ref 4.14–5.80)
RDW: 12.6 % (ref 11.6–15.4)
WBC: 8.1 x10E3/uL (ref 3.4–10.8)

## 2024-01-25 LAB — COMPREHENSIVE METABOLIC PANEL WITH GFR
ALT: 14 IU/L (ref 0–44)
AST: 17 IU/L (ref 0–40)
Albumin: 4.3 g/dL (ref 3.7–4.7)
Alkaline Phosphatase: 88 IU/L (ref 44–121)
BUN/Creatinine Ratio: 19 (ref 10–24)
BUN: 26 mg/dL (ref 8–27)
Bilirubin Total: 0.7 mg/dL (ref 0.0–1.2)
CO2: 24 mmol/L (ref 20–29)
Calcium: 9.7 mg/dL (ref 8.6–10.2)
Chloride: 98 mmol/L (ref 96–106)
Creatinine, Ser: 1.4 mg/dL — ABNORMAL HIGH (ref 0.76–1.27)
Globulin, Total: 2.8 g/dL (ref 1.5–4.5)
Glucose: 208 mg/dL — ABNORMAL HIGH (ref 70–99)
Potassium: 4.4 mmol/L (ref 3.5–5.2)
Sodium: 140 mmol/L (ref 134–144)
Total Protein: 7.1 g/dL (ref 6.0–8.5)
eGFR: 50 mL/min/1.73 — ABNORMAL LOW (ref >59)

## 2024-01-25 LAB — LIPID PANEL
Chol/HDL Ratio: 2.9 ratio (ref 0.0–5.0)
Cholesterol, Total: 126 mg/dL (ref 100–199)
HDL: 43 mg/dL (ref >39)
LDL Chol Calc (NIH): 54 mg/dL (ref 0–99)
Triglycerides: 171 mg/dL — ABNORMAL HIGH (ref 0–149)
VLDL Cholesterol Cal: 29 mg/dL (ref 5–40)

## 2024-01-28 ENCOUNTER — Ambulatory Visit: Payer: Self-pay | Admitting: Internal Medicine

## 2024-02-04 ENCOUNTER — Ambulatory Visit (INDEPENDENT_AMBULATORY_CARE_PROVIDER_SITE_OTHER): Payer: Self-pay

## 2024-02-04 DIAGNOSIS — I4819 Other persistent atrial fibrillation: Secondary | ICD-10-CM | POA: Diagnosis not present

## 2024-02-05 LAB — CUP PACEART REMOTE DEVICE CHECK
Battery Remaining Longevity: 102 mo
Battery Remaining Percentage: 97 %
Brady Statistic RA Percent Paced: 0 %
Brady Statistic RV Percent Paced: 78 %
Date Time Interrogation Session: 20250728075500
Implantable Lead Connection Status: 753985
Implantable Lead Connection Status: 753985
Implantable Lead Implant Date: 20061026
Implantable Lead Implant Date: 20061026
Implantable Lead Location: 753859
Implantable Lead Location: 753860
Implantable Lead Model: 4053
Implantable Lead Model: 4054
Implantable Lead Serial Number: 416839
Implantable Lead Serial Number: 453015
Implantable Pulse Generator Implant Date: 20190408
Lead Channel Impedance Value: 662 Ohm
Lead Channel Setting Pacing Amplitude: 3 V
Lead Channel Setting Pacing Pulse Width: 0.4 ms
Lead Channel Setting Sensing Sensitivity: 3 mV
Pulse Gen Serial Number: 822581
Zone Setting Status: 755011

## 2024-02-08 ENCOUNTER — Ambulatory Visit: Payer: Self-pay | Admitting: Internal Medicine

## 2024-04-10 NOTE — Progress Notes (Signed)
 Remote PPM Transmission

## 2024-05-05 ENCOUNTER — Ambulatory Visit (INDEPENDENT_AMBULATORY_CARE_PROVIDER_SITE_OTHER): Payer: Self-pay

## 2024-05-05 DIAGNOSIS — I4819 Other persistent atrial fibrillation: Secondary | ICD-10-CM

## 2024-05-05 LAB — CUP PACEART REMOTE DEVICE CHECK
Battery Remaining Longevity: 102 mo
Battery Remaining Percentage: 97 %
Brady Statistic RA Percent Paced: 0 %
Brady Statistic RV Percent Paced: 74 %
Date Time Interrogation Session: 20251027065100
Implantable Lead Connection Status: 753985
Implantable Lead Connection Status: 753985
Implantable Lead Implant Date: 20061026
Implantable Lead Implant Date: 20061026
Implantable Lead Location: 753859
Implantable Lead Location: 753860
Implantable Lead Model: 4053
Implantable Lead Model: 4054
Implantable Lead Serial Number: 416839
Implantable Lead Serial Number: 453015
Implantable Pulse Generator Implant Date: 20190408
Lead Channel Impedance Value: 610 Ohm
Lead Channel Setting Pacing Amplitude: 3 V
Lead Channel Setting Pacing Pulse Width: 0.4 ms
Lead Channel Setting Sensing Sensitivity: 3 mV
Pulse Gen Serial Number: 822581
Zone Setting Status: 755011

## 2024-05-07 NOTE — Progress Notes (Signed)
 Remote PPM Transmission

## 2024-05-08 ENCOUNTER — Ambulatory Visit: Payer: Self-pay | Admitting: Internal Medicine

## 2024-07-17 ENCOUNTER — Telehealth: Payer: Self-pay | Admitting: Internal Medicine

## 2024-07-17 MED ORDER — CARDIZEM LA 240 MG PO TB24: 240.0000 mg | ORAL_TABLET | Freq: Every day | ORAL | 3 refills | Status: DC

## 2024-07-17 NOTE — Telephone Encounter (Signed)
 Spoke with the patient's wife who states that they received generic diltiazem  in the mail but the patient only wants to take brand name Cardizem . She would like this to be sent to the local pharmacy. Prescription has been sent in.

## 2024-07-17 NOTE — Telephone Encounter (Signed)
 Pt c/o medication issue:  1. Name of Medication: diltiazem  (CARDIZEM  CD) 240 MG 24 hr capsule (Expired)   2. How are you currently taking this medication (dosage and times per day)? As prescribed   3. Are you having a reaction (difficulty breathing--STAT)? No   4. What is your medication issue? Pts wife asking if he can be prescribed generic medication

## 2024-07-18 ENCOUNTER — Encounter: Payer: Self-pay | Admitting: Internal Medicine

## 2024-07-21 ENCOUNTER — Other Ambulatory Visit (HOSPITAL_COMMUNITY): Payer: Self-pay

## 2024-07-21 ENCOUNTER — Telehealth: Payer: Self-pay | Admitting: Pharmacy Technician

## 2024-07-21 NOTE — Telephone Encounter (Signed)
 Pharmacy Patient Advocate Encounter   Received notification from Pt Calls Messages that prior authorization for cartia  xt is required/requested.   Insurance verification completed.   The patient is insured through [facility].   Per PA: [id number]

## 2024-07-23 MED ORDER — DILTIAZEM HCL ER 240 MG PO TB24: 240.0000 mg | ORAL_TABLET | Freq: Every day | ORAL | 3 refills | Status: AC

## 2024-08-04 ENCOUNTER — Ambulatory Visit (INDEPENDENT_AMBULATORY_CARE_PROVIDER_SITE_OTHER): Payer: Self-pay

## 2024-08-04 DIAGNOSIS — I4819 Other persistent atrial fibrillation: Secondary | ICD-10-CM

## 2024-08-06 LAB — CUP PACEART REMOTE DEVICE CHECK
Battery Remaining Longevity: 96 mo
Battery Remaining Percentage: 94 %
Brady Statistic RA Percent Paced: 0 %
Brady Statistic RV Percent Paced: 76 %
Date Time Interrogation Session: 20260126030000
Implantable Lead Connection Status: 753985
Implantable Lead Connection Status: 753985
Implantable Lead Implant Date: 20061026
Implantable Lead Implant Date: 20061026
Implantable Lead Location: 753859
Implantable Lead Location: 753860
Implantable Lead Model: 4053
Implantable Lead Model: 4054
Implantable Lead Serial Number: 416839
Implantable Lead Serial Number: 453015
Implantable Pulse Generator Implant Date: 20190408
Lead Channel Impedance Value: 653 Ohm
Lead Channel Setting Pacing Amplitude: 3 V
Lead Channel Setting Pacing Pulse Width: 0.4 ms
Lead Channel Setting Sensing Sensitivity: 3 mV
Pulse Gen Serial Number: 822581
Zone Setting Status: 755011

## 2024-08-07 ENCOUNTER — Ambulatory Visit: Payer: Self-pay | Admitting: Cardiovascular Disease

## 2024-08-07 NOTE — Progress Notes (Signed)
 Remote PPM Transmission

## 2024-11-03 ENCOUNTER — Ambulatory Visit: Payer: Self-pay

## 2025-02-02 ENCOUNTER — Ambulatory Visit

## 2025-05-04 ENCOUNTER — Ambulatory Visit
